# Patient Record
Sex: Male | Born: 1955 | Race: Black or African American | Hispanic: No | Marital: Married | Smoking: Current some day smoker
Health system: Southern US, Community
[De-identification: ages and names within clinical notes are randomized; demographics above are authoritative.]

## PROBLEM LIST (undated history)

## (undated) DIAGNOSIS — I2510 Atherosclerotic heart disease of native coronary artery without angina pectoris: Secondary | ICD-10-CM

## (undated) DIAGNOSIS — E119 Type 2 diabetes mellitus without complications: Secondary | ICD-10-CM

## (undated) DIAGNOSIS — I10 Essential (primary) hypertension: Secondary | ICD-10-CM

## (undated) DIAGNOSIS — E1169 Type 2 diabetes mellitus with other specified complication: Secondary | ICD-10-CM

## (undated) HISTORY — PX: TONSILLECTOMY: SUR1361

## (undated) HISTORY — PX: APPENDECTOMY: SHX54

## (undated) HISTORY — DX: Atherosclerotic heart disease of native coronary artery without angina pectoris: I25.10

---

## 1998-10-01 ENCOUNTER — Ambulatory Visit (HOSPITAL_COMMUNITY): Admission: RE | Admit: 1998-10-01 | Discharge: 1998-10-01 | Payer: Self-pay | Admitting: *Deleted

## 2002-03-12 ENCOUNTER — Encounter (INDEPENDENT_AMBULATORY_CARE_PROVIDER_SITE_OTHER): Payer: Self-pay | Admitting: Specialist

## 2002-03-12 ENCOUNTER — Ambulatory Visit (HOSPITAL_COMMUNITY): Admission: RE | Admit: 2002-03-12 | Discharge: 2002-03-12 | Payer: Self-pay | Admitting: *Deleted

## 2003-12-13 ENCOUNTER — Emergency Department (HOSPITAL_COMMUNITY): Admission: EM | Admit: 2003-12-13 | Discharge: 2003-12-13 | Payer: Self-pay | Admitting: Emergency Medicine

## 2007-12-31 ENCOUNTER — Ambulatory Visit: Payer: Self-pay | Admitting: Gastroenterology

## 2008-01-07 ENCOUNTER — Encounter: Payer: Self-pay | Admitting: Gastroenterology

## 2008-01-07 ENCOUNTER — Ambulatory Visit: Payer: Self-pay | Admitting: Gastroenterology

## 2008-01-10 ENCOUNTER — Encounter: Payer: Self-pay | Admitting: Gastroenterology

## 2010-07-20 NOTE — Letter (Signed)
Summary: Patient Notice- Polyp Results  [facility]  [address] [place], [place] [address]   Phone: [phone]  Fax: [phone]        January 10, 2008 MRN: [id number]    Roger Fernandez [address] [place], [place]  [address]    Dear Mr. MAGID,  I am pleased to inform you that the colon polyp(s) removed during your recent colonoscopy was (were) found to be benign (no cancer detected) upon pathologic examination.  I recommend you have a repeat colonoscopy examination in _7 years to look for recurrent polyps, as having colon polyps increases your risk for having recurrent polyps or even colon cancer in the future.  Should you develop new or worsening symptoms of abdominal pain, bowel habit changes or bleeding from the rectum or bowels, please schedule an evaluation with either your primary care physician or with me.  Additional information/recommendations:  __ No further action with gastroenterology is needed at this time. Please      follow-up with your primary care physician for your other healthcare      needs.  __ Please call [phone] to schedule a return visit to review your      situation.  __ Please keep your follow-up visit as already scheduled.  xContinue treatment plan as outlined the day of your exam.  Please call us if you are having persistent problems or have questions about your condition that have not been fully answered at this time.  Sincerely,  Louis Meckel MD  This letter has been electronically signed by your physician.

## 2010-07-20 NOTE — Procedures (Signed)
Summary: Colonoscopy   Colonoscopy  Procedure date:  01/07/2008  Findings:      Location:  [facility].    Procedures Next Due Date:    Colonoscopy: 12/2014  Patient Name: Roger Fernandez, Roger Fernandez. MRN: [id number] Procedure Procedures: Colonoscopy CPT: [phone].    with Hot Biopsy(s)CPT: [id number].    with polypectomy. CPT: A3573898.  Personnel: Endoscopist: Barbette Hair. Arlyce Dice, Roger Fernandez.  Exam Location: Outpatient  Patient Consent: Procedure, Alternatives, Risks and Benefits discussed, consent obtained, from patient.  Indications  Surveillance of: Adenomatous Polyp(s).  History  Current Medications: Patient is not currently taking Coumadin.  Pre-Exam Physical: Performed Jan 07, 2008. Cardio-pulmonary exam, HEENT exam , Abdominal exam, Mental status exam WNL.  Comments: Patient history reviewed/updated, physical performed prior to initiation of sedation?yes Exam Exam: Extent of exam reached: Cecum, extent intended: Cecum.  The cecum was identified by appendiceal orifice and IC valve. Time to Cecum: 00:03: 10. Time for Withdrawl: 00:13:50. Colon retroflexion performed. ASA Classification: I. Tolerance: good.  Monitoring: Pulse and BP monitoring, Oximetry used. Supplemental O2 given. at 2 Liters.  Colon Prep Used Moviprep for colon prep. Prep results: good.  Sedation Meds: Patient assessed and found to be appropriate for moderate (conscious) sedation. Sedation was managed by the Endoscopist. Fentanyl 75 mcg. given IV. Versed 7 mg. given IV.  Findings POLYP: Ascending Colon, Maximum size: 4 mm. sessile polyp. Procedure:  snare with cautery, Polyp sent to pathology. ICD9: Colon Polyps: 211.3.  - DIVERTICULOSIS: Descending Colon to Sigmoid Colon. ICD9: Diverticulosis: 562.10. Comments: Diffuse diverticulosis with multiple wide-mouthed diverticula.  NORMAL EXAM: Cecum.  POLYP: Sigmoid Colon, Maximum size: 2 mm. Distance from Anus 20 cm. Procedure:  hot biopsy, sent to  pathology.  POLYP: Sigmoid Colon, Maximum size: 2 mm. Distance from Anus 18 cm. Procedure:  hot biopsy, sent to pathology.  NORMAL EXAM: Rectum.  POLYP: Rectum, Maximum size: 2 mm. Distance from Anus 1 cm. Procedure:  hot biopsy, sent to pathology.   Assessment Abnormal examination, see findings above.  Diagnoses: 211.3: Colon Polyps.  562.10: Diverticulosis.   Events  Unplanned Interventions: No intervention was required.  Unplanned Events: There were no complications. Plans  Post Exam Instructions: Post sedation instructions given.  Patient Education: Patient given standard instructions for: Polyps. Diverticulosis.  Disposition: After procedure patient sent to recovery. After recovery patient sent home.  Scheduling/Referral: Colonoscopy, to Barbette Hair. Arlyce Dice, Roger Fernandez, if adenomas, around Jan 06, 2013.    REPORT OF SURGICAL PATHOLOGY   Case #: [id number] Patient Name: Roger Fernandez, Roger Fernandez. Office Chart Number:  N/A   MRN: [id number] Pathologist: Lyn Hollingshead. Delila Spence, Roger Fernandez DOB/Age  [birth year] (Age: 55)    Gender: M Date Taken:  01/07/2008 Date Received: 01/08/2008   FINAL DIAGNOSIS   ***MICROSCOPIC EXAMINATION AND DIAGNOSIS***   1.  ASCENDING COLON, POLYP:  HYPERPLASTIC POLYP.  NO ADENOMATOUS CHANGE OR MALIGNANCY IDENTIFIED.   2.  SIGMOID COLON, POLYP(S):  HYPERPLASTIC POLYP(S).  NO ADENOMATOUS CHANGE OR MALIGNANCY IDENTIFIED.   3.  RECTUM, POLYP(S):  HYPERPLASTIC POLYP(S). NO ADENOMATOUS CHANGES OR EVIDENCE OF MALIGNANCY IDENTIFIED (BIOPSY).    kv Date Reported:  01/09/2008     Lyn Hollingshead. Delila Spence, Roger Fernandez *** Electronically Signed Out By EAA ***   Clinical information (gt)   specimen(s) obtained 1: Colon, polyp(s), ascending 2: Colon, polyp(s), sigmoid 3: Rectum, polyp(s)   Gross Description 1.  Received in formalin is a tan, soft tissue fragment that is submitted in toto.  Size:  1 cm   2. Received in formalin are  tan, soft tissue fragments that are submitted in toto.   Number:  two. Size:  0.1 and 0.2 cm.   3.  Received in formalin are tan, soft tissue fragments that are submitted in toto.  Number:  multiple. Size:  0.2 to 0.3 cm.   (GP:gt, 01/08/08)   gdt/     Signed by Louis Meckel Roger Fernandez on 01/10/2008 at 6:42 PM This report was created from the original endoscopy report, which was reviewed and signed by the above listed endoscopist.   ________________________________________________________________________ colo 7 years This report was created from the original endoscopy report, which was reviewed and signed by the above listed endoscopist.   January 10, 2008 MRN: [id number]    Roger Fernandez [address] [place], [place]  [address]    Dear Mr. Roger Fernandez,  I am pleased to inform you that the colon polyp(s) removed during your recent colonoscopy was (were) found to be benign (no cancer detected) upon pathologic examination.  I recommend you have a repeat colonoscopy examination in _7 years to look for recurrent polyps, as having colon polyps increases your risk for having recurrent polyps or even colon cancer in the future.  Should you develop new or worsening symptoms of abdominal pain, bowel habit changes or bleeding from the rectum or bowels, please schedule an evaluation with either your primary care physician or with me.  Additional information/recommendations:  __ No further action with gastroenterology is needed at this time. Please      follow-up with your primary care physician for your other healthcare      needs.  __ Please call [phone] to schedule a return visit to review your      situation.  __ Please keep your follow-up visit as already scheduled.  xContinue treatment plan as outlined the day of your exam.  Please call us if you are having persistent problems or have questions about your condition that have not been fully answered at this time.  Sincerely,  Louis Meckel Roger Fernandez  This letter has been electronically signed  by your physician.   Signed by Louis Meckel Roger Fernandez on 01/10/2008 at 6:42 PM  ________________________________________________________________________   Signed by Louis Meckel Roger Fernandez on 01/10/2008 at 6:42 PM  ________________________________________________________________________  cc.   Roger Pinkerton Bellow Shaw,Roger Fernandez    January 10, 2008 MRN: [id number]    Roger Fernandez [address] [place], [place]  [address]    Dear Mr. Roger Fernandez,  I am pleased to inform you that the colon polyp(s) removed during your recent colonoscopy was (were) found to be benign (no cancer detected) upon pathologic examination.  I recommend you have a repeat colonoscopy examination in _7 years to look for recurrent polyps, as having colon polyps increases your risk for having recurrent polyps or even colon cancer in the future.  Should you develop new or worsening symptoms of abdominal pain, bowel habit changes or bleeding from the rectum or bowels, please schedule an evaluation with either your primary care physician or with me.  Additional information/recommendations:  __ No further action with gastroenterology is needed at this time. Please      follow-up with your primary care physician for your other healthcare      needs.  __ Please call [phone] to schedule a return visit to review your      situation.  __ Please keep your follow-up visit as already scheduled.  xContinue treatment plan as outlined the day of your exam.  Please  call us if you are having persistent problems or have questions about your condition that have not been fully answered at this time.  Sincerely,  Louis Meckel Roger Fernandez  This letter has been electronically signed by your physician.   Signed by Louis Meckel Roger Fernandez on 01/10/2008 at 6:42 PM  ________________________________________________________________________

## 2010-07-20 NOTE — Miscellaneous (Signed)
Summary: [facility] Previsit/prep  Clinical Lists Changes  Medications: Added new medication of MOVIPREP 100 GM  SOLR (PEG-KCL-NACL-NASULF-NA ASC-C) As per prep instructions. - Signed Rx of MOVIPREP 100 GM  SOLR (PEG-KCL-NACL-NASULF-NA ASC-C) As per prep instructions.;  #1 x 0;  Signed;  Entered by: Wyona Almas RN;  Authorized by: Louis Meckel MD;  Method used: Electronic Observations: Added new observation of NKA: T (12/31/2007 16:46)    Prescriptions: MOVIPREP 100 GM  SOLR (PEG-KCL-NACL-NASULF-NA ASC-C) As per prep instructions.  #1 x 0   Entered by:   Wyona Almas RN   Authorized by:   Louis Meckel MD   Signed by:   Wyona Almas RN on 12/31/2007   Method used:   Electronically sent to ...       [facility] [address] [phone]*       [address]       [place], [place]  [address]       Ph: [phone] or [phone]       Fax: [phone]   RxID:   [id number]

## 2010-11-05 NOTE — Op Note (Signed)
NAME:  Roger Fernandez, Roger Fernandez                          ACCOUNT NO.:  [id number]   MEDICAL RECORD NO.:  [id number]                   PATIENT TYPE:  AMB   LOCATION:  ENDO                                 FACILITY:  [facility]   PHYSICIAN:  Sharyn Dross., M.D.               DATE OF BIRTH:  1955-11-14   DATE OF PROCEDURE:  03/12/2002  DATE OF DISCHARGE:                                 OPERATIVE REPORT   PREOPERATIVE DIAGNOSIS:  History of colon polyps.   POSTOPERATIVE DIAGNOSES:  1. Diverticulosis.  2. Small polyp removed via hot biopsy.   PROCEDURE PERFORMED:  Colonoscopy with hot biopsy.   REFERRING PHYSICIAN:  Dortha Kern, M.D.   MEDICATIONS:  Demerol 90 mg IV, Versed 9 mg IV over a ten minute period of  time.   INSTRUMENT USED:  Olympus video pancolonoscope.   INDICATIONS FOR PROCEDURE:  The patient is a pleasant 55 year old black  gentleman known with a family history of colon polyps that was present.  He  came in for an evaluation at this time.  He has previously had a  colonoscopic examination in the past and was found to have colon polyps.  The patient also notes that his father had colon polyps before his  expiration, done approximately 10-12 years ago.  He has had no major  complaints or discomforts at this time and his last colon was a while ago  (approximately 5-7 years ago).  He denies any changes in stool character or  color that was present at this time.   PHYSICAL EXAMINATION:  GENERAL:  He is a pleasant gentleman who appears to  be in no acute distress.  VITAL SIGNS:  Stable.  HEENT:  Was anicteric.  NECK:  Supple.  LUNGS:  Clear to auscultation and percussion.  HEART:  Regular rate and rhythm without heaves, thrills, murmurs, or  gallops.  ABDOMEN:  Was soft.  There was no gross tenderness to palpation, no  hepatosplenomegaly appreciated.  RECTAL:  Digital rectal exam was normal.  EXTREMITIES:  Showed no cyanosis, clubbing or edema.   GROSS IMPRESSION:  Family  history of colon polyps.   RECOMMENDATIONS:  The patient was brought in for a colonoscopic examination  for the evaluation  of this process.   INFORMED CONSENT:  The patient was advised of the procedure as well as its  indications and the risks involved.  The patient has viewed the video and a  consent form was obtained.   PREOPERATIVE PREPARATION:  The patient was brought into the hospital where  the IV for IV sedating medication was started.  A monitor was placed on the  patient to monitor the patient's vital signs and oxygen saturation.  Nasal  oxygen at approximately 2-3 L per minute was used and after adequate  sedation was performed, the procedure was begun.   BOWEL PREP:  The patient was given GoLYTELY as a bowel prep  with Reglan to  help empty the bowels.  The patient tolerated the prep well without any  complications.  The quality of the prep was good to excellent.   DESCRIPTION OF PROCEDURE:  The instrument was advanced with the patient  lying in the left lateral position via the direct technique without  difficulty to approximately 90 cm proximal colon to the cecum.  This was  confirmed by palpation,  and transillumination that was noted.   There appeared to be no gross abnormalities such as masses, strictures or  vascular abnormalities that was noted at this time.  The vascular pattern  appeared to be well within normal limits throughout the colon area that was  appreciated.  The mucosal pattern showed evidence of diverticulosis that was  present in the descending to distal transverse colon area that was noted at  this time.  The diverticula appeared to be small in character and there was  no evidence of any inflammatory changes noted surrounding the diverticula  that were appreciated.  There was no increased tortuosity of the colon that  was appreciated at this time.  The ileocecal valve which appeared to be  small in character was visualized at this time.  The  instrument was  gradually retracted back and removed per rectum without any evidence of  internal hemorrhoids noted.  The area was retroflexed for viewing and this  was also confirmed.  The instrument was subsequently removed per rectum  without difficulty.   The patient tolerated the procedure well.   TREATMENT:  1. Conservative management at this time.  2. Will await the results of the biopsy report that was obtained.  3. Will have the patient follow up with me in the next two weeks at this     point.  4. High-fiber diet and a stool softener as deemed necessary.                                               Sharyn Dross., M.D.    JM/MEDQ  D:  03/12/2002  T:  03/13/2002  Job:  [id number]

## 2010-12-16 ENCOUNTER — Emergency Department (HOSPITAL_COMMUNITY)
Admission: EM | Admit: 2010-12-16 | Discharge: 2010-12-16 | Disposition: A | Discharge status: Home / Self Care | Payer: BC Managed Care – PPO | Attending: Emergency Medicine | Admitting: Emergency Medicine

## 2010-12-16 DIAGNOSIS — I10 Essential (primary) hypertension: Secondary | ICD-10-CM | POA: Insufficient documentation

## 2010-12-16 DIAGNOSIS — R220 Localized swelling, mass and lump, head: Secondary | ICD-10-CM | POA: Insufficient documentation

## 2010-12-16 DIAGNOSIS — R209 Unspecified disturbances of skin sensation: Secondary | ICD-10-CM | POA: Insufficient documentation

## 2010-12-16 DIAGNOSIS — M542 Cervicalgia: Secondary | ICD-10-CM | POA: Insufficient documentation

## 2010-12-16 DIAGNOSIS — Z79899 Other long term (current) drug therapy: Secondary | ICD-10-CM | POA: Insufficient documentation

## 2010-12-16 DIAGNOSIS — T63481A Toxic effect of venom of other arthropod, accidental (unintentional), initial encounter: Secondary | ICD-10-CM | POA: Insufficient documentation

## 2010-12-16 DIAGNOSIS — T6391XA Toxic effect of contact with unspecified venomous animal, accidental (unintentional), initial encounter: Secondary | ICD-10-CM | POA: Insufficient documentation

## 2010-12-16 DIAGNOSIS — R0789 Other chest pain: Secondary | ICD-10-CM | POA: Insufficient documentation

## 2010-12-16 DIAGNOSIS — R221 Localized swelling, mass and lump, neck: Secondary | ICD-10-CM | POA: Insufficient documentation

## 2011-04-21 ENCOUNTER — Other Ambulatory Visit: Payer: Self-pay | Admitting: Internal Medicine

## 2011-04-21 DIAGNOSIS — R209 Unspecified disturbances of skin sensation: Secondary | ICD-10-CM

## 2011-04-21 DIAGNOSIS — M79609 Pain in unspecified limb: Secondary | ICD-10-CM

## 2011-04-23 ENCOUNTER — Ambulatory Visit
Admission: RE | Admit: 2011-04-23 | Discharge: 2011-04-23 | Disposition: A | Discharge status: Home / Self Care | Payer: BC Managed Care – PPO | Source: Ambulatory Visit | Attending: Internal Medicine | Admitting: Internal Medicine

## 2011-04-23 DIAGNOSIS — R209 Unspecified disturbances of skin sensation: Secondary | ICD-10-CM

## 2011-04-23 DIAGNOSIS — M79609 Pain in unspecified limb: Secondary | ICD-10-CM

## 2011-05-31 ENCOUNTER — Other Ambulatory Visit: Payer: Self-pay | Admitting: Neurosurgery

## 2011-06-02 ENCOUNTER — Encounter (HOSPITAL_COMMUNITY): Payer: Self-pay

## 2011-06-03 ENCOUNTER — Encounter (HOSPITAL_COMMUNITY): Payer: Self-pay

## 2011-06-03 ENCOUNTER — Encounter (HOSPITAL_COMMUNITY)
Admission: RE | Admit: 2011-06-03 | Discharge: 2011-06-03 | Disposition: A | Discharge status: Home / Self Care | Payer: BC Managed Care – PPO | Source: Ambulatory Visit | Attending: Neurosurgery | Admitting: Neurosurgery

## 2011-06-03 HISTORY — DX: Essential (primary) hypertension: I10

## 2011-06-03 LAB — BASIC METABOLIC PANEL
BUN: 12 mg/dL (ref 6–23)
CO2: 26 mEq/L (ref 19–32)
Calcium: 10.1 mg/dL (ref 8.4–10.5)
Chloride: 101 mEq/L (ref 96–112)
Creatinine, Ser: 0.88 mg/dL (ref 0.50–1.35)
GFR calc Af Amer: >90 mL/min (ref >90)
GFR calc non Af Amer: >90 mL/min (ref >90)
Glucose, Bld: 82 mg/dL (ref 70–99)
Potassium: 3.8 mEq/L (ref 3.5–5.1)
Sodium: 140 mEq/L (ref 135–145)

## 2011-06-03 LAB — CBC
HCT: 40.5 % (ref 39.0–52.0)
Hemoglobin: 14.3 g/dL (ref 13.0–17.0)
MCH: 30.3 pg (ref 26.0–34.0)
MCHC: 35.3 g/dL (ref 30.0–36.0)
MCV: 85.8 fL (ref 78.0–100.0)
Platelets: 275 10*3/uL (ref 150–400)
RBC: 4.72 MIL/uL (ref 4.22–5.81)
RDW: 13.1 % (ref 11.5–15.5)
WBC: 9.6 10*3/uL (ref 4.0–10.5)

## 2011-06-03 LAB — SURGICAL PCR SCREEN
MRSA, PCR: NEGATIVE
Staphylococcus aureus: NEGATIVE

## 2011-06-03 NOTE — Progress Notes (Signed)
DR Garnette Scheuermann CALLED FOR ECHO,STRESS TEST.  DR  Martha Clan CALLED FOR CXR AND EKG.

## 2011-06-03 NOTE — Pre-Procedure Instructions (Signed)
20 JO CERONE  06/03/2011   Your procedure is scheduled on:  06/06/11  Report to [facility] at 1;20PM   Call this number if you have problems the morning of surgery: [phone]   Remember:   Do not eat food:After Midnight.  May have clear liquids: up to 4 Hours before arrival.  Clear liquids include soda, tea, black coffee, apple or grape juice, broth.  Take these medicines the morning of surgery with A SIP OF WATER: NONE   Do not wear jewelry, make-up or nail polish.  Do not wear lotions, powders, or perfumes. You may wear deodorant.  Do not shave 48 hours prior to surgery.  Do not bring valuables to the hospital.  Contacts, dentures or bridgework may not be worn into surgery.  Leave suitcase in the car. After surgery it may be brought to your room.  For patients admitted to the hospital, checkout time is 11:00 AM the day of discharge.   Patients discharged the day of surgery will not be allowed to drive home.  Name and phone number of your driver: FAMILY  Special Instructions: CHG Shower Use Special Wash: 1/2 bottle night before surgery and 1/2 bottle morning of surgery.   Please read over the following fact sheets that you were given: Pain Booklet, MRSA Information and Surgical Site Infection Prevention

## 2011-06-05 MED ORDER — CEFAZOLIN SODIUM-DEXTROSE 2-3 GM-% IV SOLR
2.0000 g | INTRAVENOUS | Status: DC
  Filled 2011-06-05: qty 50

## 2011-06-06 ENCOUNTER — Encounter (HOSPITAL_COMMUNITY): Payer: Self-pay | Admitting: Anesthesiology

## 2011-06-06 ENCOUNTER — Ambulatory Visit (HOSPITAL_COMMUNITY): Payer: BC Managed Care – PPO

## 2011-06-06 ENCOUNTER — Encounter (HOSPITAL_COMMUNITY): Payer: Self-pay | Admitting: Surgery

## 2011-06-06 ENCOUNTER — Ambulatory Visit (HOSPITAL_COMMUNITY): Payer: BC Managed Care – PPO | Admitting: Anesthesiology

## 2011-06-06 ENCOUNTER — Encounter (HOSPITAL_COMMUNITY)
Admission: RE | Disposition: A | Discharge status: Home / Self Care | Payer: Self-pay | Source: Ambulatory Visit | Attending: Neurosurgery

## 2011-06-06 ENCOUNTER — Ambulatory Visit (HOSPITAL_COMMUNITY)
Admission: RE | Admit: 2011-06-06 | Discharge: 2011-06-07 | Disposition: A | Discharge status: Home / Self Care | Payer: BC Managed Care – PPO | Source: Ambulatory Visit | Attending: Neurosurgery | Admitting: Neurosurgery

## 2011-06-06 DIAGNOSIS — Z01812 Encounter for preprocedural laboratory examination: Secondary | ICD-10-CM | POA: Insufficient documentation

## 2011-06-06 DIAGNOSIS — M4712 Other spondylosis with myelopathy, cervical region: Secondary | ICD-10-CM | POA: Insufficient documentation

## 2011-06-06 HISTORY — PX: ANTERIOR CERVICAL DECOMP/DISCECTOMY FUSION: SHX1161

## 2011-06-06 SURGERY — ANTERIOR CERVICAL DECOMPRESSION/DISCECTOMY FUSION 3 LEVELS
Anesthesia: General | Site: Spine Cervical | Wound class: Clean

## 2011-06-06 MED ORDER — LIDOCAINE-EPINEPHRINE 1 %-1:100000 IJ SOLN
INTRAMUSCULAR | Status: DC | PRN
  Administered 2011-06-06: 5 mL

## 2011-06-06 MED ORDER — SODIUM CHLORIDE 0.9 % IR SOLN
Status: DC | PRN
  Administered 2011-06-06: 18:00:00

## 2011-06-06 MED ORDER — MORPHINE SULFATE 4 MG/ML IJ SOLN
4.0000 mg | INTRAMUSCULAR | Status: DC | PRN
  Administered 2011-06-07: 4 mg via INTRAMUSCULAR
  Filled 2011-06-06: qty 1

## 2011-06-06 MED ORDER — KCL IN DEXTROSE-NACL 20-5-0.45 MEQ/L-%-% IV SOLN
INTRAVENOUS | Status: DC
  Filled 2011-06-06 (×5): qty 1000

## 2011-06-06 MED ORDER — PHENYLEPHRINE HCL 10 MG/ML IJ SOLN
INTRAMUSCULAR | Status: DC | PRN
  Administered 2011-06-06 (×3): 80 ug via INTRAVENOUS

## 2011-06-06 MED ORDER — PHENOL 1.4 % MT LIQD: 1.0000 | OROMUCOSAL | Status: DC | PRN

## 2011-06-06 MED ORDER — CEFAZOLIN SODIUM 1-5 GM-% IV SOLN
INTRAVENOUS | Status: DC | PRN
  Administered 2011-06-06: 2 g via INTRAVENOUS

## 2011-06-06 MED ORDER — GLYCOPYRROLATE 0.2 MG/ML IJ SOLN
INTRAMUSCULAR | Status: DC | PRN
  Administered 2011-06-06: .8 mg via INTRAVENOUS

## 2011-06-06 MED ORDER — ACETAMINOPHEN 650 MG RE SUPP: 650.0000 mg | RECTAL | Status: DC | PRN

## 2011-06-06 MED ORDER — HYDROMORPHONE HCL PF 1 MG/ML IJ SOLN
0.2500 mg | INTRAMUSCULAR | Status: DC | PRN
  Administered 2011-06-06 (×4): 0.5 mg via INTRAVENOUS

## 2011-06-06 MED ORDER — ACETAMINOPHEN 325 MG PO TABS: 650.0000 mg | ORAL_TABLET | ORAL | Status: DC | PRN

## 2011-06-06 MED ORDER — HYDROCODONE-ACETAMINOPHEN 5-325 MG PO TABS: 1.0000 | ORAL_TABLET | ORAL | Status: DC | PRN

## 2011-06-06 MED ORDER — CYCLOBENZAPRINE HCL 10 MG PO TABS
10.0000 mg | ORAL_TABLET | Freq: Three times a day (TID) | ORAL | Status: DC | PRN
  Administered 2011-06-06 – 2011-06-07 (×2): 10 mg via ORAL
  Filled 2011-06-06 (×3): qty 1

## 2011-06-06 MED ORDER — NEOSTIGMINE METHYLSULFATE 1 MG/ML IJ SOLN
INTRAMUSCULAR | Status: DC | PRN
  Administered 2011-06-06: 5 mg via INTRAVENOUS

## 2011-06-06 MED ORDER — MIDAZOLAM HCL 5 MG/5ML IJ SOLN
INTRAMUSCULAR | Status: DC | PRN
  Administered 2011-06-06: 2 mg via INTRAVENOUS

## 2011-06-06 MED ORDER — SODIUM CHLORIDE 0.9 % IJ SOLN: 3.0000 mL | INTRAMUSCULAR | Status: DC | PRN

## 2011-06-06 MED ORDER — THROMBIN 20000 UNITS EX KIT
PACK | CUTANEOUS | Status: DC | PRN
  Administered 2011-06-06: 20000 [IU] via TOPICAL

## 2011-06-06 MED ORDER — HEMOSTATIC AGENTS (NO CHARGE) OPTIME
TOPICAL | Status: DC | PRN
  Administered 2011-06-06: 1 via TOPICAL

## 2011-06-06 MED ORDER — BUPIVACAINE HCL (PF) 0.25 % IJ SOLN
INTRAMUSCULAR | Status: DC | PRN
  Administered 2011-06-06: 5 mL

## 2011-06-06 MED ORDER — KETOROLAC TROMETHAMINE 30 MG/ML IJ SOLN
30.0000 mg | Freq: Once | INTRAMUSCULAR | Status: AC
  Administered 2011-06-06: 30 mg via INTRAVENOUS

## 2011-06-06 MED ORDER — DEXAMETHASONE SODIUM PHOSPHATE 4 MG/ML IJ SOLN
INTRAMUSCULAR | Status: DC | PRN
  Administered 2011-06-06: 8 mg via INTRAVENOUS

## 2011-06-06 MED ORDER — ROCURONIUM BROMIDE 100 MG/10ML IV SOLN
INTRAVENOUS | Status: DC | PRN
  Administered 2011-06-06: 10 mg via INTRAVENOUS
  Administered 2011-06-06: 50 mg via INTRAVENOUS
  Administered 2011-06-06: 10 mg via INTRAVENOUS
  Administered 2011-06-06: 5 mg via INTRAVENOUS

## 2011-06-06 MED ORDER — ONDANSETRON HCL 4 MG/2ML IJ SOLN
INTRAMUSCULAR | Status: DC | PRN
  Administered 2011-06-06: 4 mg via INTRAVENOUS

## 2011-06-06 MED ORDER — FENTANYL CITRATE 0.05 MG/ML IJ SOLN
INTRAMUSCULAR | Status: DC | PRN
  Administered 2011-06-06: 100 ug via INTRAVENOUS
  Administered 2011-06-06 (×4): 50 ug via INTRAVENOUS

## 2011-06-06 MED ORDER — ZOLPIDEM TARTRATE 5 MG PO TABS: 10.0000 mg | ORAL_TABLET | Freq: Every evening | ORAL | Status: DC | PRN

## 2011-06-06 MED ORDER — OXYCODONE-ACETAMINOPHEN 5-325 MG PO TABS
1.0000 | ORAL_TABLET | ORAL | Status: DC | PRN
  Administered 2011-06-06 – 2011-06-07 (×4): 2 via ORAL
  Filled 2011-06-06 (×4): qty 2

## 2011-06-06 MED ORDER — ONDANSETRON HCL 4 MG/2ML IJ SOLN: 4.0000 mg | Freq: Four times a day (QID) | INTRAMUSCULAR | Status: DC | PRN

## 2011-06-06 MED ORDER — KETOROLAC TROMETHAMINE 30 MG/ML IJ SOLN
30.0000 mg | Freq: Four times a day (QID) | INTRAMUSCULAR | Status: DC
  Administered 2011-06-06 – 2011-06-07 (×3): 30 mg via INTRAVENOUS
  Filled 2011-06-06 (×6): qty 1

## 2011-06-06 MED ORDER — ALUM & MAG HYDROXIDE-SIMETH 200-200-20 MG/5ML PO SUSP: 30.0000 mL | Freq: Four times a day (QID) | ORAL | Status: DC | PRN

## 2011-06-06 MED ORDER — MENTHOL 3 MG MT LOZG: 1.0000 | LOZENGE | OROMUCOSAL | Status: DC | PRN

## 2011-06-06 MED ORDER — SODIUM CHLORIDE 0.9 % IJ SOLN: 3.0000 mL | Freq: Two times a day (BID) | INTRAMUSCULAR | Status: DC

## 2011-06-06 MED ORDER — LACTATED RINGERS IV SOLN
INTRAVENOUS | Status: DC | PRN
  Administered 2011-06-06 (×4): via INTRAVENOUS

## 2011-06-06 MED ORDER — PROPOFOL 10 MG/ML IV EMUL
INTRAVENOUS | Status: DC | PRN
  Administered 2011-06-06: 200 mg via INTRAVENOUS

## 2011-06-06 MED ORDER — 0.9 % SODIUM CHLORIDE (POUR BTL) OPTIME
TOPICAL | Status: DC | PRN
  Administered 2011-06-06: 1000 mL

## 2011-06-06 MED ORDER — HYDROXYZINE HCL 50 MG/ML IM SOLN: 50.0000 mg | INTRAMUSCULAR | Status: DC | PRN

## 2011-06-06 MED ORDER — HYDROXYZINE HCL 25 MG PO TABS: 50.0000 mg | ORAL_TABLET | ORAL | Status: DC | PRN

## 2011-06-06 SURGICAL SUPPLY — 55 items
ADH SKN CLS APL DERMABOND .7 (GAUZE/BANDAGES/DRESSINGS) ×2
ALLOGRAFT CA 6X14X11 (Bone Implant) ×6 IMPLANT
BAG DECANTER FOR FLEXI CONT (MISCELLANEOUS) ×2 IMPLANT
BIT DRILL NEURO 2X3.1 SFT TUCH (MISCELLANEOUS) ×1 IMPLANT
BLADE ULTRA TIP 2M (BLADE) ×2 IMPLANT
BRUSH SCRUB EZ PLAIN DRY (MISCELLANEOUS) ×2 IMPLANT
CANISTER SUCTION 2500CC (MISCELLANEOUS) ×2 IMPLANT
CLOTH BEACON ORANGE TIMEOUT ST (SAFETY) ×2 IMPLANT
CONT SPEC 4OZ CLIKSEAL STRL BL (MISCELLANEOUS) ×2 IMPLANT
COVER MAYO STAND STRL (DRAPES) ×2 IMPLANT
DECANTER SPIKE VIAL GLASS SM (MISCELLANEOUS) ×2 IMPLANT
DERMABOND ADVANCED (GAUZE/BANDAGES/DRESSINGS) ×2
DERMABOND ADVANCED .7 DNX12 (GAUZE/BANDAGES/DRESSINGS) ×2 IMPLANT
DRAPE LAPAROTOMY 100X72 PEDS (DRAPES) ×2 IMPLANT
DRAPE MICROSCOPE LEICA (MISCELLANEOUS) ×2 IMPLANT
DRAPE POUCH INSTRU U-SHP 10X18 (DRAPES) ×2 IMPLANT
DRAPE PROXIMA HALF (DRAPES) IMPLANT
DRILL NEURO 2X3.1 SOFT TOUCH (MISCELLANEOUS) ×2
ELECT COATED BLADE 2.86 ST (ELECTRODE) ×2 IMPLANT
ELECT REM PT RETURN 9FT ADLT (ELECTROSURGICAL) ×2
ELECTRODE REM PT RTRN 9FT ADLT (ELECTROSURGICAL) ×1 IMPLANT
GLOVE BIO SURGEON STRL SZ 6.5 (GLOVE) ×4 IMPLANT
GLOVE BIOGEL PI IND STRL 6.5 (GLOVE) ×2 IMPLANT
GLOVE BIOGEL PI IND STRL 7.5 (GLOVE) ×1 IMPLANT
GLOVE BIOGEL PI IND STRL 8 (GLOVE) ×1 IMPLANT
GLOVE BIOGEL PI INDICATOR 6.5 (GLOVE) ×2
GLOVE BIOGEL PI INDICATOR 7.5 (GLOVE) ×1
GLOVE BIOGEL PI INDICATOR 8 (GLOVE) ×1
GLOVE ECLIPSE 7.5 STRL STRAW (GLOVE) ×4 IMPLANT
GLOVE EXAM NITRILE LRG STRL (GLOVE) IMPLANT
GLOVE EXAM NITRILE MD LF STRL (GLOVE) ×4 IMPLANT
GLOVE EXAM NITRILE XL STR (GLOVE) IMPLANT
GLOVE EXAM NITRILE XS STR PU (GLOVE) IMPLANT
GOWN BRE IMP SLV AUR LG STRL (GOWN DISPOSABLE) ×6 IMPLANT
GOWN BRE IMP SLV AUR XL STRL (GOWN DISPOSABLE) ×2 IMPLANT
GOWN STRL REIN 2XL LVL4 (GOWN DISPOSABLE) IMPLANT
HEAD HALTER (SOFTGOODS) ×2 IMPLANT
KIT BASIN OR (CUSTOM PROCEDURE TRAY) ×2 IMPLANT
KIT ROOM TURNOVER OR (KITS) ×2 IMPLANT
NEEDLE HYPO 25X1 1.5 SAFETY (NEEDLE) ×2 IMPLANT
NEEDLE SPNL 22GX3.5 QUINCKE BK (NEEDLE) ×4 IMPLANT
NS IRRIG 1000ML POUR BTL (IV SOLUTION) ×2 IMPLANT
PACK LAMINECTOMY NEURO (CUSTOM PROCEDURE TRAY) ×2 IMPLANT
PAD ARMBOARD 7.5X6 YLW CONV (MISCELLANEOUS) ×6 IMPLANT
RUBBERBAND STERILE (MISCELLANEOUS) ×4 IMPLANT
SPONGE INTESTINAL PEANUT (DISPOSABLE) ×4 IMPLANT
SPONGE SURGIFOAM ABS GEL 100 (HEMOSTASIS) ×2 IMPLANT
SUT VIC AB 0 CT1 18XCR BRD8 (SUTURE) IMPLANT
SUT VIC AB 0 CT1 8-18 (SUTURE)
SUT VIC AB 2-0 CP2 18 (SUTURE) ×2 IMPLANT
SUT VIC AB 3-0 SH 8-18 (SUTURE) ×4 IMPLANT
SYR 20ML ECCENTRIC (SYRINGE) ×2 IMPLANT
TOWEL OR 17X24 6PK STRL BLUE (TOWEL DISPOSABLE) ×2 IMPLANT
TOWEL OR 17X26 10 PK STRL BLUE (TOWEL DISPOSABLE) ×4 IMPLANT
WATER STERILE IRR 1000ML POUR (IV SOLUTION) ×2 IMPLANT

## 2011-06-06 NOTE — Progress Notes (Signed)
Dr. Jacklynn Bue notified of EKG, Hx HTN and Stress test. No further orders received.

## 2011-06-06 NOTE — Anesthesia Procedure Notes (Signed)
Procedure Name: Intubation Date/Time: 06/06/2011 5:05 PM Performed by: Glendora Score Pre-anesthesia Checklist: Patient identified, Emergency Drugs available, Suction available and Patient being monitored Patient Re-evaluated:Patient Re-evaluated prior to inductionOxygen Delivery Method: Circle System Utilized Preoxygenation: Pre-oxygenation with 100% oxygen Intubation Type: IV induction Ventilation: Mask ventilation without difficulty and Oral airway inserted - appropriate to patient size Laryngoscope Size: Miller and 2 Grade View: Grade IV Tube size: 7.5 mm Number of attempts: 2 Airway Equipment and Method: stylet and video-laryngoscopy Placement Confirmation: ETT inserted through vocal cords under direct vision,  positive ETCO2 and breath sounds checked- equal and bilateral Secured at: 21 cm Tube secured with: Tape Dental Injury: Teeth and Oropharynx as per pre-operative assessment

## 2011-06-06 NOTE — Progress Notes (Signed)
Filed Vitals:   06/06/11 1323 06/06/11 2033 06/06/11 2045  BP: 139/84 148/76 151/73  Pulse: 79 70 78  Temp: 98.3 F (36.8 C) 98.4 F (36.9 C)   TempSrc: Oral    Resp: 18 18 21   SpO2: 97% 100% 100%    Patient resting comfortably in PACU. Moving all 4 extremities well. Incision is clean and dry without erythema swelling or drainage. Foley to straight drainage.   Plan: Patient to be transferred to the 3500 unit. Encouraged to ambulate. We'll DC Foley at 0600.

## 2011-06-06 NOTE — Preoperative (Signed)
Beta Blockers   Reason not to administer Beta Blockers:Not Applicable 

## 2011-06-06 NOTE — Op Note (Signed)
06/06/2011  8:31 PM  PATIENT:  Roger Fernandez  55 y.o. 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 Patient was placed in 10 pounds of halter traction. The neck was prepped with Betadine soap and solution and draped in a sterile fashion. A obliquel incision was made on the left side of the neck paralleling the anterior border of the sternocleidomastoid.. The line of the incision was infiltrated with local anesthetic with epinephrine. Dissection was carried down thru the subcutaneous tissue and platysma, bipolar cautery was used to maintain hemostasis. Dissection was then carried out thru an avascular plane leaving the sternocleidomastoid carotid artery and jugular vein laterally and the trachea and esophagus medially. The ventral aspect of the vertebral column was identified and a localizing x-ray was taken. The C3-4 C4-5 C5-6 levels were identified. The annulus at each level was incised and the disc space entered. Discectomy was performed with micro-curettes and pituitary rongeurs. The operating microscope was draped and brought into the field provided additional magnification illumination and visualization. Discectomy was continued  posteriorly thru the disc space and then the cartilaginous endplate was removed using micro-curettes along with the high-speed drill. Posterior osteophytic overgrowth was removed at each level using the high-speed drill along with a 2 mm thin footplated Kerrison punch. Posterior longitudinal ligament along with disc herniation was carefully removed, decompressing the spinal canal and thecal sac. We then continued to remove osteophytic overgrowth and disc material decompressing the neural foramina and exiting nerve roots bilaterally. Once the decompression was completed hemostasis was established at each level with the use of Gelfoam with thrombin and bipolar cautery. The Gelfoam was removed the wound irrigated and hemostasis confirmed. We then measured the height of each intravertebral disc space level and selected a 6 millimeter in height structural allograft for the C3-4 level, a 6 millimeter in height structural allograft for the C4-5 level, and a 6 millimeter in height structural allograft for the C5-6 level . Each was hydrated in saline solution and then gently positioned in the intravertebral disc space and countersunk. We then selected a 52 millimeter in height Tether cervical plate. It was positioned over the fusion construct and secured to the vertebra with 4 x 15 mm screws. Each screw hole was started with the high-speed drill and then the screws placed, once all the screws were placed final tightening was performed. The wound was irrigated with bacitracin solution checked for hemostasis which was established and confirmed. An x-ray was taken which showed the grafts and plate in good position and the overall alignment was good. We then proceeded with closure. The platysma was closed with interrupted inverted 2-0 undyed Vicryl suture, the subcutaneous and subcuticular closed with interrupted inverted 3-0 undyed Vicryl suture. The skin edges were approximated with Dermabond. Following surgery the patient was  taken out of cervical traction. To be reversed and  the anesthetic and taken to the recovery room for further care.   PLAN OF CARE: Admit for overnight observation  PATIENT DISPOSITION:  PACU - hemodynamically stable.   Delay start of Pharmacological VTE agent (>24hrs) due to surgical blood loss or risk of bleeding:  yes

## 2011-06-06 NOTE — Anesthesia Postprocedure Evaluation (Signed)
  Anesthesia Post-op Note  Patient: Roger Fernandez  Procedure(s) Performed:  ANTERIOR CERVICAL DECOMPRESSION/DISCECTOMY FUSION 3 LEVELS - Cervical Three-four,Cervical Four-Five,Cervical Five-Six anterior cervical decompression with fusion plating and bonegraft  Patient Location: PACU  Anesthesia Type: General  Level of Consciousness: awake  Airway and Oxygen Therapy: Patient Spontanous Breathing  Post-op Pain: mild  Post-op Assessment: Post-op Vital signs reviewed  Post-op Vital Signs: stable  Complications: No apparent anesthesia complications

## 2011-06-06 NOTE — Transfer of Care (Signed)
Immediate Anesthesia Transfer of Care Note  Patient: Roger Fernandez  Procedure(s) Performed:  ANTERIOR CERVICAL DECOMPRESSION/DISCECTOMY FUSION 3 LEVELS - Cervical Three-four,Cervical Four-Five,Cervical Five-Six anterior cervical decompression with fusion plating and bonegraft  Patient Location: PACU  Anesthesia Type: General  Level of Consciousness: awake and patient cooperative  Airway & Oxygen Therapy: Patient Spontanous Breathing and Patient connected to face mask oxygen  Post-op Assessment: Report given to PACU RN  Post vital signs: Reviewed and stable  Complications: No apparent anesthesia complications

## 2011-06-06 NOTE — Anesthesia Preprocedure Evaluation (Signed)
Anesthesia Evaluation  Patient identified by MRN, date of birth, ID band Patient awake    Reviewed: Allergy & Precautions, H&P , NPO status , Patient's Chart, lab work & pertinent test results  Airway Mallampati: II  Neck ROM: full    Dental   Pulmonary          Cardiovascular hypertension,     Neuro/Psych    GI/Hepatic   Endo/Other    Renal/GU      Musculoskeletal   Abdominal   Peds  Hematology   Anesthesia Other Findings   Reproductive/Obstetrics                           Anesthesia Physical Anesthesia Plan  ASA: II  Anesthesia Plan: General   Post-op Pain Management:    Induction: Intravenous  Airway Management Planned: Oral ETT  Additional Equipment:   Intra-op Plan:   Post-operative Plan: Extubation in OR  Informed Consent: I have reviewed the patients History and Physical, chart, labs and discussed the procedure including the risks, benefits and alternatives for the proposed anesthesia with the patient or authorized representative who has indicated his/her understanding and acceptance.     Plan Discussed with: CRNA and Surgeon  Anesthesia Plan Comments:         Anesthesia Quick Evaluation

## 2011-06-06 NOTE — H&P (Signed)
Subjective: Patient is a 55 y.o. 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 Symptoms began in late October of this year. He had pain from the neck running down through the left upper extremity to the shoulder arm and forearm. He had numbness in the left thumb. X-ray show multilevel degenerative disease and spondylosis at C3-4 C4-5 C5-6 and C6-7. MRI scan shows at C3-4 broad-based spondylitic disc protrusion with flattening of the ventral aspect of the spinal cord worse on the right than the left. At C4-5 there was severe stenosis with a large disc protrusion centrally extending bilaterally worse the right than the left with significant cord compression and increased signal within the spinal cord on the right side. At C5-6 systolic disc protrusion a broad-based fashion, at C6-7 there is degenerative disease and spondylosis but no significant stenosis or spinal cord compression.  Patient is admitted now for a 3 level CIII-4 C4-5 and C5-6 ACDF.   There are no active problems to display for this patient.  Past Medical History  Diagnosis Date  . Hypertension     DR Martha Clan PCP  DR [facility] CARDIAC    Past Surgical History  Procedure Date  . Tonsillectomy   . Appendectomy     Prescriptions prior to admission  Medication Sig Dispense Refill  . Benzocaine (ORAJEL MT) Use as directed 1 application in the mouth or throat as needed. For ulcers.       Marland Kitchen lisinopril-hydrochlorothiazide (PRINZIDE,ZESTORETIC) 20-12.5 MG per tablet Take 1 tablet by mouth daily.        . tadalafil (CIALIS) 10 MG tablet Take 10 mg by mouth daily as needed. For ED       No Known Allergies  History  Substance Use Topics  . Smoking status: Current Some Day Smoker    Types: Cigars  . Smokeless tobacco: Not on file  . Alcohol Use: Yes     OCC.    History reviewed. No pertinent family  history.   Review of Systems Pertinent items are noted in HPI.  Objective: Vital signs in last 24 hours: Temp:  [98.3 F (36.8 C)] 98.3 F (36.8 C) (12/17 1323) Pulse Rate:  [79] 79  (12/17 1323) Resp:  [18] 18  (12/17 1323) BP: (139)/(84) 139/84 mmHg (12/17 1323) SpO2:  [97 %] 97 % (12/17 1323)  EXAM: Patient is a well-developed well-nourished black male in no acute distress. Lungs are clear to auscultation , the patient has symmetrical respiratory excursion. Heart has a regular rate and rhythm normal S1 and S2 no murmur.   Abdomen is soft nontender nondistended bowel sounds are present. Extremity examination shows no clubbing cyanosis or edema. Musculoskeletal examination shows no dislocation of the cervical spinous processes or paracervical musculature. He has a good range of motion of the neck through flexion and extension and lateral flexion to either side, without significant discomfort on range of motion neck. He does have a mildly positive Phalen's test in the left and a negative Phalen's test on the right. Motor examination shows 5 over 5 strength in the upper extremities including the deltoid biceps triceps and intrinsics and grip. Sensation is intact to pinprick throughout the digits of the upper extremities. Reflexes are symmetrical and without evidence of pathologic reflexes. Patient has a normal gait and stance.    Data Review:CBC    Component Value Date/Time   WBC 9.6 06/03/2011 1433   RBC  4.72 06/03/2011 1433   HGB 14.3 06/03/2011 1433   HCT 40.5 06/03/2011 1433   PLT 275 06/03/2011 1433   MCV 85.8 06/03/2011 1433   MCH 30.3 06/03/2011 1433   MCHC 35.3 06/03/2011 1433   RDW 13.1 06/03/2011 1433                          BMET    Component Value Date/Time   NA 140 06/03/2011 1433   K 3.8 06/03/2011 1433   CL 101 06/03/2011 1433   CO2 26 06/03/2011 1433   GLUCOSE 82 06/03/2011 1433   BUN 12 06/03/2011 1433   CREATININE 0.88 06/03/2011 1433   CALCIUM 10.1  06/03/2011 1433   GFRNONAA >90 06/03/2011 1433   GFRAA >90 06/03/2011 1433     Assessment/Plan: Patient was multilevel degenerative disease and spondylosis in the cervical spine with superimposed cervical disc herniations with significant resulting cervical stenosis and spinal cord compression with increased signal within the spinal cord consistent with cervical myelopathy. However examination shows normal reflexes and no pathologic reflexes.  Because of the severity of the spinal cord compression and altered signal from within the spinal cord it's felt that he should undergo surgical decompression via 3 level C3-4 C4-5 and C5-6 ACDF.I've discussed with the patient the nature of his condition, the nature the surgical procedure, the typical length of surgery, hospital stay, and overall recuperation. We discussed limitations postoperatively. I discussed risks of surgery including risks of infection, bleeding, possibly need for transfusion, the risk of nerve root dysfunction with pain, weakness, numbness, or paresthesias, the risk of spinal cord dysfunction with paralysis of all 4 limbs and quadriplegia, and the risk of dural tear and CSF leakage and possible need for further surgery, the risk of esophageal dysfunction causing dysphagia and the risk of laryngeal dysfunction causing hoarseness of the voice, the risk of failure of the arthrodesis and the possible need for further surgery, and the risk of anesthetic complications including myocardial infarction, stroke, pneumonia, and death. We also discussed the need for postoperative immobilization in a cervical collar. Understanding all this the patient does wish to proceed with surgery and is admitted for such.    Hewitt Shorts, MD 06/06/2011 4:31 PM

## 2011-06-07 ENCOUNTER — Encounter (HOSPITAL_COMMUNITY): Payer: Self-pay | Admitting: Neurosurgery

## 2011-06-07 MED ORDER — OXYCODONE-ACETAMINOPHEN 5-325 MG PO TABS: 1.0000 | ORAL_TABLET | ORAL | Status: AC | PRN

## 2011-06-07 MED ORDER — CYCLOBENZAPRINE HCL 10 MG PO TABS: 10.0000 mg | ORAL_TABLET | Freq: Three times a day (TID) | ORAL | Status: AC | PRN

## 2011-06-07 MED ORDER — SODIUM CHLORIDE 0.9 % IV SOLN: 250.0000 mL | INTRAVENOUS | Status: DC

## 2011-06-07 NOTE — Plan of Care (Signed)
Problem: Consults Goal: Diagnosis - Spinal Surgery Outcome: Completed/Met Date Met:  06/07/11 Cervical Spine Fusion     

## 2011-06-07 NOTE — Progress Notes (Signed)
Filed Vitals:   06/06/11 2200 06/07/11 0000 06/07/11 0400 06/07/11 0758  BP: 177/99 155/72 152/75 137/87  Pulse: 81 96 75 73  Temp: 98.2 F (36.8 C) 98.4 F (36.9 C) 98.4 F (36.9 C) 98.5 F (36.9 C)  TempSrc:  Oral Oral   Resp: 20 18 18 18   SpO2:  100% 100% 95%    Patient is doing well. His Foley was DC'd about 4 1/2 hours ago but he has not yet voided. Wound is clean and dry and healing well. He's been up and ambulating.   Plan: Have encouraged patient continue to ambulate actively, we'll monitor his urine output and followup with him later today.

## 2011-06-07 NOTE — Discharge Summary (Signed)
Physician Discharge Summary  Patient ID: Roger Fernandez MRN: [id number] DOB/AGE: 27-Apr-1956 55 y.o.  Admit date: 06/06/2011 Discharge date: 06/07/2011  Admission Diagnoses: Spondylitic cervical disc herniation with myelopathy, cervical spondylosis with myelopathy, cervical degenerative disc disease with myelopathy, cervical stenosis  Discharge Diagnoses: Spondylitic cervical disc herniation with myelopathy, cervical spondylosis with myelopathy, cervical degenerative disc disease with myelopathy cervical stenosis Active Problems:  * No active hospital problems. *    Discharged Condition: good  Hospital Course: Patient was admitted underwent a three-level C3-4 C4-5 and C5-6 ACDF. He has done well. His Foley catheter was discontinued this morning and he has voided well since then. He is up and ambulate actively in the halls.  Consults: none  Significant Diagnostic Studies: None  Treatments: None  Discharge Exam: Blood pressure 172/96, pulse 81, temperature 98.3 F (36.8 C), temperature source Oral, resp. rate 18, SpO2 97.00%. Wound is clean and dry no swelling erythema or drainage. Motor examination shows 5 over 5 strength in the upper extremities including the deltoid biceps triceps and intrinsics and grip. Sensation is intact to pinprick throughout the digits of the upper extremities. Reflexes are symmetrical and without evidence of pathologic reflexes. Patient has a normal gait and stance.   Disposition: Home   Current Discharge Medication List    START taking these medications   Details  cyclobenzaprine (FLEXERIL) 10 MG tablet Take 1 tablet (10 mg total) by mouth 3 (three) times daily as needed for muscle spasms. Qty: 50 tablet, Refills: 1    oxyCODONE-acetaminophen (PERCOCET) 5-325 MG per tablet Take 1-2 tablets by mouth every 4 (four) hours as needed for pain. Qty: 50 tablet, Refills: 0      CONTINUE these medications which have NOT CHANGED   Details  Benzocaine  (ORAJEL MT) Use as directed 1 application in the mouth or throat as needed. For ulcers.     lisinopril-hydrochlorothiazide (PRINZIDE,ZESTORETIC) 20-12.5 MG per tablet Take 1 tablet by mouth daily.      tadalafil (CIALIS) 10 MG tablet Take 10 mg by mouth daily as needed. For ED         Signed: Hewitt Shorts, MD 06/07/2011, 3:30 PM

## 2011-06-07 NOTE — Discharge Instructions (Signed)

## 2011-06-20 ENCOUNTER — Emergency Department (HOSPITAL_COMMUNITY): Payer: BC Managed Care – PPO

## 2011-06-20 ENCOUNTER — Encounter (HOSPITAL_COMMUNITY): Payer: Self-pay | Admitting: Nurse Practitioner

## 2011-06-20 ENCOUNTER — Emergency Department (HOSPITAL_COMMUNITY)
Admission: EM | Admit: 2011-06-20 | Discharge: 2011-06-20 | Disposition: A | Discharge status: Home / Self Care | Payer: BC Managed Care – PPO | Attending: Emergency Medicine | Admitting: Emergency Medicine

## 2011-06-20 DIAGNOSIS — R29898 Other symptoms and signs involving the musculoskeletal system: Secondary | ICD-10-CM | POA: Insufficient documentation

## 2011-06-20 DIAGNOSIS — I10 Essential (primary) hypertension: Secondary | ICD-10-CM | POA: Insufficient documentation

## 2011-06-20 DIAGNOSIS — Z9889 Other specified postprocedural states: Secondary | ICD-10-CM | POA: Insufficient documentation

## 2011-06-20 DIAGNOSIS — M5412 Radiculopathy, cervical region: Secondary | ICD-10-CM | POA: Insufficient documentation

## 2011-06-20 DIAGNOSIS — F17200 Nicotine dependence, unspecified, uncomplicated: Secondary | ICD-10-CM | POA: Insufficient documentation

## 2011-06-20 DIAGNOSIS — G542 Cervical root disorders, not elsewhere classified: Secondary | ICD-10-CM

## 2011-06-20 DIAGNOSIS — Z79899 Other long term (current) drug therapy: Secondary | ICD-10-CM | POA: Insufficient documentation

## 2011-06-20 DIAGNOSIS — M79609 Pain in unspecified limb: Secondary | ICD-10-CM | POA: Insufficient documentation

## 2011-06-20 NOTE — H&P (Signed)
Reason for Consult:Right arm weakness Referring Physician: Daymien Goth is an 55 y.o. male.   HPI: Patient had 3 level ACDF by Dr. Newell Coral on 06/06/2011 (C3/4, 4/5, 5/6) for severe cord compression and left radiculopathy.  Preoperatively, he had severe cord compression at C4/5 (4mm spinal canal, cord compression worse on right with increased cord signal).  Patient did well postoperatively, but on 12/24, began to develop right arm weakness.  He called and spoke to on-call NS doctor and was started on methylprednisolone.  He called back today and complained of progressively worsening weakness involving his entire right arm, despite steroids.  I advised him to come to ER for evaluation.  Past Medical History  Diagnosis Date  . Hypertension     DR Martha Clan PCP  DR [facility] CARDIAC    Past Surgical History  Procedure Date  . Tonsillectomy   . Appendectomy   . Anterior cervical decomp/discectomy fusion 06/06/2011    Procedure: ANTERIOR CERVICAL DECOMPRESSION/DISCECTOMY FUSION 3 LEVELS;  Surgeon: Hewitt Shorts;  Location: [facility]UR[facility] ORS;  Service: Neurosurgery;  Laterality: N/A;  Cervical Three-four,Cervical Four-Five,Cervical Five-Six anterior cervical decompression with fusion plating and bonegraft    History reviewed. No pertinent family history.  Social History:  reports that he has been smoking Cigars.  He does not have any smokeless tobacco history on file. He reports that he drinks alcohol. He reports that he does not use illicit drugs.  Allergies: No Known Allergies  Medications: I have reviewed the patient's current medications.  No results found for this or any previous visit (from the past 48 hour(s)).  Ct Cervical Spine Wo Contrast  06/20/2011  *RADIOLOGY REPORT*  Clinical Data: 55 year old male status post cervical spine surgery 06/06/2011.  Decreased range of motion and pain.  CT CERVICAL SPINE WITHOUT CONTRAST  Technique:  Multidetector CT imaging of the  cervical spine was performed. Multiplanar CT image reconstructions were also generated.  Comparison: Intraoperative radiographs 06/06/2011 and earlier.  Findings: Negative lung apices.  Negative visualized posterior fossa brain structures.  Satisfactory appearance of the paraspinal soft tissues.  Minimal postoperative changes to the left of midline.  No prevertebral or postoperative collection identified.  Visualized skull base is intact.  No atlanto-occipital dissociation.  Cervicothoracic junction alignment is within normal limits.  Bilateral posterior element alignment is within normal limits.  C2-C3:  Stable.  Mild facet and uncovertebral hypertrophy.  C3-C4:  Interval ACDF.  A single cortical screw is in place and appears intact.  Interbody implant in place with no adverse features.  Uncovertebral hypertrophy greater on the right primarily is responsible for foraminal stenosis.  AP thecal sac at this level is estimated at 7-8 mm.  C4-C5:  ACDF hardware. C4 cortical screw present to the left of midline and shows suboptimal bony purchase along the left lateral vertebral body. C5 cortical screw is without adverse features.  The interbody implant with no adverse features. There is mild right lateral listhesis at the level which may be new.  This is most apparent at the facet joints (series 402 image 46).  The right side facet is most diastatic.  Bilateral uncovertebral hypertrophy contribute to foraminal stenosis.  AP thecal sac at this level is estimated at 7 mm, versus 4 mm preoperatively.  C5-C6:  ACDF.  Bilateral C6 cortical screws are intact without adverse features.  Interbody implant without adverse features. Uncovertebral hypertrophy contributes to bilateral foraminal stenosis.  Estimated AP thecal sac 8 mm.  C6-C7:  Stable disc  space narrowing and circumferential endplate spurring.  C7-T1:  Mild facet and uncovertebral hypertrophy.  IMPRESSION: 1.  Interval ACDF.  No adverse postoperative soft tissue  findings. 2.  Hardware at the C4 vertebral body is suboptimally positioned. There is mild lateral listhesis at C4-C5 (series 402 image 46). Thecal sac patency appears improved from the preoperative study.  Original Report Authenticated By: Harley Hallmark, M.D.    Review of Systems - Negative except as noted    Blood pressure 135/87, pulse 79, temperature 98.4 F (36.9 C), temperature source Oral, resp. rate 18, height 6\' 1"  (1.854 m), weight 90.719 kg (200 lb), SpO2 97.00%. Incision C/D/I without erythema or swelling.  Dermabond in place. Full strength in all motor groups of upper and lower extremities with exception of right deltoid which is out and minimal weakness of right biceps.  Mild hyperreflexia with positive Hoffman's signs in fingers, normal reflexes in both legs.  Denies significant numbness or pain.  Assessment/Plan:  Isolated delayed right C5 radiculopathy after 3 level ACDF.  This does not appear to affect any other motor groups.  CT C-spine shows well-positioned bone grafts and plate/screws with significant improvement in AP diameter at each level, but paricularly at C4/5.  I reassured patient that this should improve with time.  I will send him home on decadron 4mg  TID for the next four days, along with Protonix, with instructions to follow up with Dr. Newell Coral at the end of this week.  Patient knows to mobilize his shoulder (I explained exercises) and to call or return to ER if weakness progresses or begins to involve left side.  Dorian Heckle, MD 06/20/2011, 8:20 PM

## 2011-06-20 NOTE — ED Notes (Signed)
Pt seen by MD Venetia Maxon and consulted to f/u x1week. Pt states understanding of discharge instructions and denies questions. MD stern gave Rx for Decadron and Protonix (pt understands administration instructions and denies questions). Pt NAD, resp e/u, AOx4, ambulatory with steady gait at time of discharge.

## 2011-06-20 NOTE — ED Provider Notes (Signed)
I saw pt primarily and care was continued in the CDU.  I reviewed and agree with the PA care.  Caree Wolpert, MD 06/20/11 2341 

## 2011-06-20 NOTE — ED Notes (Signed)
States unable to elevate R arm up since 12/24. Reports spinal surgery on 12/17. Spoke to surgeon and instructed pt to come to ER, Dr Venetia Maxon.

## 2011-06-20 NOTE — ED Provider Notes (Addendum)
History     CSN: [id number]  Arrival date & time 06/20/11  1512   First MD Initiated Contact with Patient 06/20/11 1903      Chief Complaint  Patient presents with  . Arm Pain    (Consider location/radiation/quality/duration/timing/severity/associated sxs/prior treatment) HPI Comments: Neck surgery done 2 weeks ago and for the last one week has had progressive weakness of the right arm. No particular pain.  Patient is a 55 y.o. male presenting with extremity weakness. The history is provided by the patient.  Extremity Weakness This is a new problem. Episode onset: Since Christmas Eve. The problem occurs constantly. The problem has been gradually worsening. Associated symptoms comments: No pain or numbness just shoulder weakness and now is unable to eat with her right hand or comb his hair.. The symptoms are aggravated by nothing. The symptoms are relieved by nothing. He has tried nothing for the symptoms. The treatment provided no relief.    Past Medical History  Diagnosis Date  . Hypertension     DR Martha Clan PCP  DR [facility] CARDIAC    Past Surgical History  Procedure Date  . Tonsillectomy   . Appendectomy   . Anterior cervical decomp/discectomy fusion 06/06/2011    Procedure: ANTERIOR CERVICAL DECOMPRESSION/DISCECTOMY FUSION 3 LEVELS;  Surgeon: Hewitt Shorts;  Location: [facility] NEURO ORS;  Service: Neurosurgery;  Laterality: N/A;  Cervical Three-four,Cervical Four-Five,Cervical Five-Six anterior cervical decompression with fusion plating and bonegraft    History reviewed. No pertinent family history.  History  Substance Use Topics  . Smoking status: Current Some Day Smoker    Types: Cigars  . Smokeless tobacco: Not on file  . Alcohol Use: Yes     OCC.      Review of Systems  Musculoskeletal: Positive for extremity weakness.  All other systems reviewed and are negative.    Allergies  Review of patient's allergies indicates no known allergies.  Home  Medications   Current Outpatient Rx  Name Route Sig Dispense Refill  . LISINOPRIL-HYDROCHLOROTHIAZIDE 20-12.5 MG PO TABS Oral Take 1 tablet by mouth daily.     . METHYLPREDNISOLONE 4 MG PO KIT Oral Take 4 mg by mouth See admin instructions. follow package directions     . OXYCODONE-ACETAMINOPHEN 5-325 MG PO TABS Oral Take 1 tablet by mouth every 6 (six) hours as needed. For pain     . TADALAFIL 10 MG PO TABS Oral Take 10 mg by mouth daily as needed. For ED      BP 128/78  Pulse 87  Temp(Src) 99 F (37.2 C) (Oral)  Resp 16  Ht 6\' 1"  (1.854 m)  Wt 200 lb (90.719 kg)  BMI 26.39 kg/m2  SpO2 98%  Physical Exam  Nursing note and vitals reviewed. Constitutional: He is oriented to person, place, and time. He appears well-developed and well-nourished. No distress.  HENT:  Head: Normocephalic and atraumatic.  Mouth/Throat: Oropharynx is clear and moist.  Eyes: Conjunctivae and EOM are normal. Pupils are equal, round, and reactive to light.  Neck: Neck supple.       Soft collar present  Cardiovascular: Normal rate, regular rhythm and intact distal pulses.   No murmur heard. Pulmonary/Chest: Effort normal and breath sounds normal. No respiratory distress. He has no wheezes. He has no rales.  Musculoskeletal: Normal range of motion. He exhibits no edema and no tenderness.  Neurological: He is alert and oriented to person, place, and time. No sensory deficit.       Deltoid strength  on the right 0-1/5. 5 out of 5 strength of the bicep, tricep, hand  Skin: Skin is warm and dry. No rash noted. No erythema.  Psychiatric: He has a normal mood and affect. His behavior is normal.    ED Course  Procedures (including critical care time)  Labs Reviewed - No data to display Ct Cervical Spine Wo Contrast  06/20/2011  *RADIOLOGY REPORT*  Clinical Data: 55 year old male status post cervical spine surgery 06/06/2011.  Decreased range of motion and pain.  CT CERVICAL SPINE WITHOUT CONTRAST  Technique:   Multidetector CT imaging of the cervical spine was performed. Multiplanar CT image reconstructions were also generated.  Comparison: Intraoperative radiographs 06/06/2011 and earlier.  Findings: Negative lung apices.  Negative visualized posterior fossa brain structures.  Satisfactory appearance of the paraspinal soft tissues.  Minimal postoperative changes to the left of midline.  No prevertebral or postoperative collection identified.  Visualized skull base is intact.  No atlanto-occipital dissociation.  Cervicothoracic junction alignment is within normal limits.  Bilateral posterior element alignment is within normal limits.  C2-C3:  Stable.  Mild facet and uncovertebral hypertrophy.  C3-C4:  Interval ACDF.  A single cortical screw is in place and appears intact.  Interbody implant in place with no adverse features.  Uncovertebral hypertrophy greater on the right primarily is responsible for foraminal stenosis.  AP thecal sac at this level is estimated at 7-8 mm.  C4-C5:  ACDF hardware. C4 cortical screw present to the left of midline and shows suboptimal bony purchase along the left lateral vertebral body. C5 cortical screw is without adverse features.  The interbody implant with no adverse features. There is mild right lateral listhesis at the level which may be new.  This is most apparent at the facet joints (series 402 image 46).  The right side facet is most diastatic.  Bilateral uncovertebral hypertrophy contribute to foraminal stenosis.  AP thecal sac at this level is estimated at 7 mm, versus 4 mm preoperatively.  C5-C6:  ACDF.  Bilateral C6 cortical screws are intact without adverse features.  Interbody implant without adverse features. Uncovertebral hypertrophy contributes to bilateral foraminal stenosis.  Estimated AP thecal sac 8 mm.  C6-C7:  Stable disc space narrowing and circumferential endplate spurring.  C7-T1:  Mild facet and uncovertebral hypertrophy.  IMPRESSION: 1.  Interval ACDF.  No  adverse postoperative soft tissue findings. 2.  Hardware at the C4 vertebral body is suboptimally positioned. There is mild lateral listhesis at C4-C5 (series 402 image 46). Thecal sac patency appears improved from the preoperative study.  Original Report Authenticated By: Harley Hallmark, M.D.     No diagnosis found.    MDM   Patient had neck surgery 2 weeks ago and Christmas Eve started noting weakness in his right arm progressing to where now he is unable to raise his right arm. Arm can be fully ranged with passive range of motion however when arm was develop he is unable to hold it. Normal handgrip, bicep and tricep strength. Weakness appears to be in the deltoid early. Patient states he's starting to feel mild weakness on the left as well but currently it has normal strength. Spoke with Dr. Venetia Maxon and will get a CT of his neck for further evaluation.    8:12 PM CT done in Dr. Venetia Maxon coming to see patient    Gwyneth Sprout, MD 06/20/11 2012  Gwyneth Sprout, MD 06/20/11 2012

## 2011-06-20 NOTE — ED Provider Notes (Signed)
Patient seen by Dr. Venetia Maxon.  Dr. Venetia Maxon provided patient with prescriptions and follow-up instructions.  Jimmye Norman, NP 06/20/11 [phone]

## 2011-06-20 NOTE — ED Notes (Signed)
Paged dr Venetia Maxon to notify of pt arrival in ed

## 2012-03-12 ENCOUNTER — Other Ambulatory Visit: Payer: Self-pay | Admitting: Neurosurgery

## 2012-03-12 DIAGNOSIS — M5000 Cervical disc disorder with myelopathy, unspecified cervical region: Secondary | ICD-10-CM

## 2012-03-12 DIAGNOSIS — M4802 Spinal stenosis, cervical region: Secondary | ICD-10-CM

## 2012-03-12 DIAGNOSIS — M4712 Other spondylosis with myelopathy, cervical region: Secondary | ICD-10-CM

## 2012-03-15 ENCOUNTER — Ambulatory Visit
Admission: RE | Admit: 2012-03-15 | Discharge: 2012-03-15 | Disposition: A | Discharge status: Home / Self Care | Payer: BC Managed Care – PPO | Source: Ambulatory Visit | Attending: Neurosurgery | Admitting: Neurosurgery

## 2012-03-15 DIAGNOSIS — M4802 Spinal stenosis, cervical region: Secondary | ICD-10-CM

## 2012-03-15 DIAGNOSIS — M4712 Other spondylosis with myelopathy, cervical region: Secondary | ICD-10-CM

## 2012-03-15 DIAGNOSIS — M5000 Cervical disc disorder with myelopathy, unspecified cervical region: Secondary | ICD-10-CM

## 2012-06-27 ENCOUNTER — Other Ambulatory Visit: Payer: Self-pay | Admitting: Neurosurgery

## 2012-06-27 DIAGNOSIS — M5000 Cervical disc disorder with myelopathy, unspecified cervical region: Secondary | ICD-10-CM

## 2012-06-27 DIAGNOSIS — M4802 Spinal stenosis, cervical region: Secondary | ICD-10-CM

## 2012-06-27 DIAGNOSIS — M4712 Other spondylosis with myelopathy, cervical region: Secondary | ICD-10-CM

## 2012-07-17 ENCOUNTER — Other Ambulatory Visit: Payer: BC Managed Care – PPO

## 2012-07-18 ENCOUNTER — Other Ambulatory Visit: Payer: BC Managed Care – PPO

## 2012-07-19 ENCOUNTER — Ambulatory Visit
Admission: RE | Admit: 2012-07-19 | Discharge: 2012-07-19 | Disposition: A | Discharge status: Home / Self Care | Payer: BC Managed Care – PPO | Source: Ambulatory Visit | Attending: Neurosurgery | Admitting: Neurosurgery

## 2012-07-19 DIAGNOSIS — M4712 Other spondylosis with myelopathy, cervical region: Secondary | ICD-10-CM

## 2012-07-19 DIAGNOSIS — M4802 Spinal stenosis, cervical region: Secondary | ICD-10-CM

## 2012-07-19 DIAGNOSIS — M5000 Cervical disc disorder with myelopathy, unspecified cervical region: Secondary | ICD-10-CM

## 2014-10-23 ENCOUNTER — Encounter: Payer: Self-pay | Admitting: Gastroenterology

## 2015-01-23 ENCOUNTER — Encounter: Payer: Self-pay | Admitting: Gastroenterology

## 2015-10-07 ENCOUNTER — Other Ambulatory Visit: Payer: Self-pay | Admitting: Gastroenterology

## 2015-10-07 DIAGNOSIS — K640 First degree hemorrhoids: Secondary | ICD-10-CM | POA: Diagnosis not present

## 2015-10-07 DIAGNOSIS — D126 Benign neoplasm of colon, unspecified: Secondary | ICD-10-CM | POA: Diagnosis not present

## 2015-10-07 DIAGNOSIS — D127 Benign neoplasm of rectosigmoid junction: Secondary | ICD-10-CM | POA: Diagnosis not present

## 2015-10-07 DIAGNOSIS — K621 Rectal polyp: Secondary | ICD-10-CM | POA: Diagnosis not present

## 2015-10-07 DIAGNOSIS — K5730 Diverticulosis of large intestine without perforation or abscess without bleeding: Secondary | ICD-10-CM | POA: Diagnosis not present

## 2015-10-07 DIAGNOSIS — D125 Benign neoplasm of sigmoid colon: Secondary | ICD-10-CM | POA: Diagnosis not present

## 2015-10-07 DIAGNOSIS — D122 Benign neoplasm of ascending colon: Secondary | ICD-10-CM | POA: Diagnosis not present

## 2015-10-07 DIAGNOSIS — R194 Change in bowel habit: Secondary | ICD-10-CM | POA: Diagnosis not present

## 2016-01-21 DIAGNOSIS — E291 Testicular hypofunction: Secondary | ICD-10-CM | POA: Diagnosis not present

## 2016-01-21 DIAGNOSIS — I10 Essential (primary) hypertension: Secondary | ICD-10-CM | POA: Diagnosis not present

## 2016-01-21 DIAGNOSIS — E119 Type 2 diabetes mellitus without complications: Secondary | ICD-10-CM | POA: Diagnosis not present

## 2016-05-26 DIAGNOSIS — M7711 Lateral epicondylitis, right elbow: Secondary | ICD-10-CM | POA: Diagnosis not present

## 2016-05-26 DIAGNOSIS — Z23 Encounter for immunization: Secondary | ICD-10-CM | POA: Diagnosis not present

## 2016-05-26 DIAGNOSIS — E119 Type 2 diabetes mellitus without complications: Secondary | ICD-10-CM | POA: Diagnosis not present

## 2016-05-26 DIAGNOSIS — Z1389 Encounter for screening for other disorder: Secondary | ICD-10-CM | POA: Diagnosis not present

## 2016-05-26 DIAGNOSIS — I10 Essential (primary) hypertension: Secondary | ICD-10-CM | POA: Diagnosis not present

## 2016-05-26 DIAGNOSIS — E784 Other hyperlipidemia: Secondary | ICD-10-CM | POA: Diagnosis not present

## 2016-08-10 DIAGNOSIS — I10 Essential (primary) hypertension: Secondary | ICD-10-CM | POA: Diagnosis not present

## 2016-08-10 DIAGNOSIS — E291 Testicular hypofunction: Secondary | ICD-10-CM | POA: Diagnosis not present

## 2016-08-10 DIAGNOSIS — E119 Type 2 diabetes mellitus without complications: Secondary | ICD-10-CM | POA: Diagnosis not present

## 2016-08-10 DIAGNOSIS — Z125 Encounter for screening for malignant neoplasm of prostate: Secondary | ICD-10-CM | POA: Diagnosis not present

## 2016-08-17 DIAGNOSIS — E291 Testicular hypofunction: Secondary | ICD-10-CM | POA: Diagnosis not present

## 2016-08-17 DIAGNOSIS — Z125 Encounter for screening for malignant neoplasm of prostate: Secondary | ICD-10-CM | POA: Diagnosis not present

## 2016-08-17 DIAGNOSIS — E119 Type 2 diabetes mellitus without complications: Secondary | ICD-10-CM | POA: Diagnosis not present

## 2016-08-17 DIAGNOSIS — I10 Essential (primary) hypertension: Secondary | ICD-10-CM | POA: Diagnosis not present

## 2016-08-17 DIAGNOSIS — E784 Other hyperlipidemia: Secondary | ICD-10-CM | POA: Diagnosis not present

## 2016-08-17 DIAGNOSIS — Z0000 Encounter for general adult medical examination without abnormal findings: Secondary | ICD-10-CM | POA: Diagnosis not present

## 2016-08-17 DIAGNOSIS — Z1389 Encounter for screening for other disorder: Secondary | ICD-10-CM | POA: Diagnosis not present

## 2017-03-16 DIAGNOSIS — E119 Type 2 diabetes mellitus without complications: Secondary | ICD-10-CM | POA: Diagnosis not present

## 2017-03-16 DIAGNOSIS — I10 Essential (primary) hypertension: Secondary | ICD-10-CM | POA: Diagnosis not present

## 2017-03-16 DIAGNOSIS — E784 Other hyperlipidemia: Secondary | ICD-10-CM | POA: Diagnosis not present

## 2017-03-16 DIAGNOSIS — E291 Testicular hypofunction: Secondary | ICD-10-CM | POA: Diagnosis not present

## 2017-05-02 DIAGNOSIS — E119 Type 2 diabetes mellitus without complications: Secondary | ICD-10-CM | POA: Diagnosis not present

## 2017-09-25 DIAGNOSIS — R82998 Other abnormal findings in urine: Secondary | ICD-10-CM | POA: Diagnosis not present

## 2017-09-25 DIAGNOSIS — E119 Type 2 diabetes mellitus without complications: Secondary | ICD-10-CM | POA: Diagnosis not present

## 2017-09-25 DIAGNOSIS — Z0000 Encounter for general adult medical examination without abnormal findings: Secondary | ICD-10-CM | POA: Diagnosis not present

## 2017-09-25 DIAGNOSIS — Z125 Encounter for screening for malignant neoplasm of prostate: Secondary | ICD-10-CM | POA: Diagnosis not present

## 2017-10-02 DIAGNOSIS — Z1389 Encounter for screening for other disorder: Secondary | ICD-10-CM | POA: Diagnosis not present

## 2017-10-02 DIAGNOSIS — E291 Testicular hypofunction: Secondary | ICD-10-CM | POA: Diagnosis not present

## 2017-10-02 DIAGNOSIS — Z0000 Encounter for general adult medical examination without abnormal findings: Secondary | ICD-10-CM | POA: Diagnosis not present

## 2017-10-02 DIAGNOSIS — I10 Essential (primary) hypertension: Secondary | ICD-10-CM | POA: Diagnosis not present

## 2017-10-02 DIAGNOSIS — E7849 Other hyperlipidemia: Secondary | ICD-10-CM | POA: Diagnosis not present

## 2017-10-02 DIAGNOSIS — E119 Type 2 diabetes mellitus without complications: Secondary | ICD-10-CM | POA: Diagnosis not present

## 2018-04-04 DIAGNOSIS — E291 Testicular hypofunction: Secondary | ICD-10-CM | POA: Diagnosis not present

## 2018-04-04 DIAGNOSIS — E7849 Other hyperlipidemia: Secondary | ICD-10-CM | POA: Diagnosis not present

## 2018-04-04 DIAGNOSIS — I10 Essential (primary) hypertension: Secondary | ICD-10-CM | POA: Diagnosis not present

## 2018-04-04 DIAGNOSIS — E119 Type 2 diabetes mellitus without complications: Secondary | ICD-10-CM | POA: Diagnosis not present

## 2018-04-25 DIAGNOSIS — H900 Conductive hearing loss, bilateral: Secondary | ICD-10-CM | POA: Diagnosis not present

## 2018-04-25 DIAGNOSIS — J343 Hypertrophy of nasal turbinates: Secondary | ICD-10-CM | POA: Diagnosis not present

## 2018-04-25 DIAGNOSIS — Z6829 Body mass index (BMI) 29.0-29.9, adult: Secondary | ICD-10-CM | POA: Diagnosis not present

## 2018-04-25 DIAGNOSIS — H6123 Impacted cerumen, bilateral: Secondary | ICD-10-CM | POA: Diagnosis not present

## 2018-04-25 DIAGNOSIS — H60392 Other infective otitis externa, left ear: Secondary | ICD-10-CM | POA: Diagnosis not present

## 2018-05-04 DIAGNOSIS — H60331 Swimmer's ear, right ear: Secondary | ICD-10-CM | POA: Diagnosis not present

## 2018-10-08 DIAGNOSIS — E7849 Other hyperlipidemia: Secondary | ICD-10-CM | POA: Diagnosis not present

## 2018-10-08 DIAGNOSIS — E119 Type 2 diabetes mellitus without complications: Secondary | ICD-10-CM | POA: Diagnosis not present

## 2018-10-08 DIAGNOSIS — Z125 Encounter for screening for malignant neoplasm of prostate: Secondary | ICD-10-CM | POA: Diagnosis not present

## 2018-10-11 DIAGNOSIS — R82998 Other abnormal findings in urine: Secondary | ICD-10-CM | POA: Diagnosis not present

## 2018-10-11 DIAGNOSIS — I10 Essential (primary) hypertension: Secondary | ICD-10-CM | POA: Diagnosis not present

## 2018-10-15 DIAGNOSIS — Z0000 Encounter for general adult medical examination without abnormal findings: Secondary | ICD-10-CM | POA: Diagnosis not present

## 2018-10-15 DIAGNOSIS — E119 Type 2 diabetes mellitus without complications: Secondary | ICD-10-CM | POA: Diagnosis not present

## 2018-10-15 DIAGNOSIS — E785 Hyperlipidemia, unspecified: Secondary | ICD-10-CM | POA: Diagnosis not present

## 2018-10-15 DIAGNOSIS — E291 Testicular hypofunction: Secondary | ICD-10-CM | POA: Diagnosis not present

## 2018-10-15 DIAGNOSIS — Z1331 Encounter for screening for depression: Secondary | ICD-10-CM | POA: Diagnosis not present

## 2018-10-15 DIAGNOSIS — I10 Essential (primary) hypertension: Secondary | ICD-10-CM | POA: Diagnosis not present

## 2018-12-28 DIAGNOSIS — Z20828 Contact with and (suspected) exposure to other viral communicable diseases: Secondary | ICD-10-CM | POA: Diagnosis not present

## 2019-10-24 ENCOUNTER — Other Ambulatory Visit: Payer: Self-pay | Admitting: Internal Medicine

## 2019-10-24 DIAGNOSIS — F17290 Nicotine dependence, other tobacco product, uncomplicated: Secondary | ICD-10-CM

## 2019-11-04 ENCOUNTER — Ambulatory Visit
Admission: RE | Admit: 2019-11-04 | Discharge: 2019-11-04 | Disposition: A | Discharge status: Home / Self Care | Payer: Medicare Other | Source: Ambulatory Visit | Attending: Internal Medicine | Admitting: Internal Medicine

## 2019-11-04 DIAGNOSIS — F17290 Nicotine dependence, other tobacco product, uncomplicated: Secondary | ICD-10-CM

## 2019-12-27 ENCOUNTER — Other Ambulatory Visit: Payer: Self-pay

## 2019-12-27 ENCOUNTER — Emergency Department (HOSPITAL_COMMUNITY): Payer: Medicare Other

## 2019-12-27 ENCOUNTER — Emergency Department (HOSPITAL_BASED_OUTPATIENT_CLINIC_OR_DEPARTMENT_OTHER): Payer: Medicare Other

## 2019-12-27 ENCOUNTER — Observation Stay (HOSPITAL_COMMUNITY)
Admission: EM | Admit: 2019-12-27 | Discharge: 2019-12-28 | Disposition: A | Discharge status: Home / Self Care | Payer: Medicare Other | Attending: Family Medicine | Admitting: Family Medicine

## 2019-12-27 ENCOUNTER — Encounter (HOSPITAL_COMMUNITY): Payer: Self-pay | Admitting: Internal Medicine

## 2019-12-27 DIAGNOSIS — L03116 Cellulitis of left lower limb: Principal | ICD-10-CM | POA: Diagnosis present

## 2019-12-27 DIAGNOSIS — Z7984 Long term (current) use of oral hypoglycemic drugs: Secondary | ICD-10-CM | POA: Diagnosis not present

## 2019-12-27 DIAGNOSIS — E119 Type 2 diabetes mellitus without complications: Secondary | ICD-10-CM

## 2019-12-27 DIAGNOSIS — I10 Essential (primary) hypertension: Secondary | ICD-10-CM | POA: Insufficient documentation

## 2019-12-27 DIAGNOSIS — Y929 Unspecified place or not applicable: Secondary | ICD-10-CM | POA: Insufficient documentation

## 2019-12-27 DIAGNOSIS — R609 Edema, unspecified: Secondary | ICD-10-CM

## 2019-12-27 DIAGNOSIS — Y999 Unspecified external cause status: Secondary | ICD-10-CM | POA: Insufficient documentation

## 2019-12-27 DIAGNOSIS — Z20822 Contact with and (suspected) exposure to covid-19: Secondary | ICD-10-CM | POA: Diagnosis not present

## 2019-12-27 DIAGNOSIS — S8012XA Contusion of left lower leg, initial encounter: Secondary | ICD-10-CM | POA: Insufficient documentation

## 2019-12-27 DIAGNOSIS — R0781 Pleurodynia: Secondary | ICD-10-CM | POA: Diagnosis not present

## 2019-12-27 DIAGNOSIS — E785 Hyperlipidemia, unspecified: Secondary | ICD-10-CM | POA: Diagnosis present

## 2019-12-27 DIAGNOSIS — R600 Localized edema: Secondary | ICD-10-CM

## 2019-12-27 DIAGNOSIS — Z79899 Other long term (current) drug therapy: Secondary | ICD-10-CM | POA: Diagnosis not present

## 2019-12-27 DIAGNOSIS — F17290 Nicotine dependence, other tobacco product, uncomplicated: Secondary | ICD-10-CM | POA: Insufficient documentation

## 2019-12-27 DIAGNOSIS — E1169 Type 2 diabetes mellitus with other specified complication: Secondary | ICD-10-CM | POA: Diagnosis present

## 2019-12-27 DIAGNOSIS — R0789 Other chest pain: Secondary | ICD-10-CM | POA: Insufficient documentation

## 2019-12-27 DIAGNOSIS — E1159 Type 2 diabetes mellitus with other circulatory complications: Secondary | ICD-10-CM | POA: Diagnosis present

## 2019-12-27 DIAGNOSIS — S80812A Abrasion, left lower leg, initial encounter: Secondary | ICD-10-CM | POA: Diagnosis present

## 2019-12-27 DIAGNOSIS — Y939 Activity, unspecified: Secondary | ICD-10-CM | POA: Insufficient documentation

## 2019-12-27 DIAGNOSIS — S8992XA Unspecified injury of left lower leg, initial encounter: Secondary | ICD-10-CM | POA: Diagnosis not present

## 2019-12-27 DIAGNOSIS — I152 Hypertension secondary to endocrine disorders: Secondary | ICD-10-CM | POA: Diagnosis present

## 2019-12-27 HISTORY — DX: Type 2 diabetes mellitus without complications: E11.9

## 2019-12-27 HISTORY — DX: Type 2 diabetes mellitus with other specified complication: E11.69

## 2019-12-27 LAB — BASIC METABOLIC PANEL
Anion gap: 11 (ref 5–15)
BUN: 15 mg/dL (ref 8–23)
CO2: 23 mmol/L (ref 22–32)
Calcium: 9.5 mg/dL (ref 8.9–10.3)
Chloride: 102 mmol/L (ref 98–111)
Creatinine, Ser: 0.95 mg/dL (ref 0.61–1.24)
GFR calc Af Amer: >60 mL/min (ref >60)
GFR calc non Af Amer: >60 mL/min (ref >60)
Glucose, Bld: 88 mg/dL (ref 70–99)
Potassium: 4.1 mmol/L (ref 3.5–5.1)
Sodium: 136 mmol/L (ref 135–145)

## 2019-12-27 LAB — CBC
HCT: 41 % (ref 39.0–52.0)
Hemoglobin: 13.7 g/dL (ref 13.0–17.0)
MCH: 29.5 pg (ref 26.0–34.0)
MCHC: 33.4 g/dL (ref 30.0–36.0)
MCV: 88.2 fL (ref 80.0–100.0)
Platelets: 302 10*3/uL (ref 150–400)
RBC: 4.65 MIL/uL (ref 4.22–5.81)
RDW: 13 % (ref 11.5–15.5)
WBC: 11.3 10*3/uL — ABNORMAL HIGH (ref 4.0–10.5)
nRBC: 0 % (ref 0.0–0.2)

## 2019-12-27 MED ORDER — CEFAZOLIN SODIUM-DEXTROSE 1-4 GM/50ML-% IV SOLN
1.0000 g | Freq: Once | INTRAVENOUS | Status: AC
  Administered 2019-12-27: 1 g via INTRAVENOUS
  Filled 2019-12-27: qty 50

## 2019-12-27 MED ORDER — ATORVASTATIN CALCIUM 10 MG PO TABS
20.0000 mg | ORAL_TABLET | Freq: Every day | ORAL | Status: DC
  Administered 2019-12-28: 20 mg via ORAL
  Filled 2019-12-27: qty 2

## 2019-12-27 MED ORDER — ACETAMINOPHEN 650 MG RE SUPP: 650.0000 mg | Freq: Four times a day (QID) | RECTAL | Status: DC | PRN

## 2019-12-27 MED ORDER — CEFAZOLIN SODIUM-DEXTROSE 1-4 GM/50ML-% IV SOLN
1.0000 g | Freq: Three times a day (TID) | INTRAVENOUS | Status: DC
  Administered 2019-12-28: 1 g via INTRAVENOUS
  Filled 2019-12-27: qty 50

## 2019-12-27 MED ORDER — LISINOPRIL 20 MG PO TABS
20.0000 mg | ORAL_TABLET | Freq: Every day | ORAL | Status: DC
  Administered 2019-12-28: 20 mg via ORAL
  Filled 2019-12-27: qty 1

## 2019-12-27 MED ORDER — ACETAMINOPHEN 325 MG PO TABS: 650.0000 mg | ORAL_TABLET | Freq: Four times a day (QID) | ORAL | Status: DC | PRN

## 2019-12-27 MED ORDER — HYDROCHLOROTHIAZIDE 12.5 MG PO CAPS
12.5000 mg | ORAL_CAPSULE | Freq: Every day | ORAL | Status: DC
  Administered 2019-12-28: 12.5 mg via ORAL
  Filled 2019-12-27: qty 1

## 2019-12-27 MED ORDER — METFORMIN HCL 500 MG PO TABS
500.0000 mg | ORAL_TABLET | Freq: Two times a day (BID) | ORAL | Status: DC
  Administered 2019-12-28: 500 mg via ORAL
  Filled 2019-12-27: qty 1

## 2019-12-27 MED ORDER — ENOXAPARIN SODIUM 40 MG/0.4ML ~~LOC~~ SOLN
40.0000 mg | Freq: Every day | SUBCUTANEOUS | Status: DC
  Administered 2019-12-28: 40 mg via SUBCUTANEOUS
  Filled 2019-12-27: qty 0.4

## 2019-12-27 MED ORDER — IBUPROFEN 200 MG PO TABS: 600.0000 mg | ORAL_TABLET | Freq: Four times a day (QID) | ORAL | Status: DC | PRN

## 2019-12-27 MED ORDER — LISINOPRIL-HYDROCHLOROTHIAZIDE 20-12.5 MG PO TABS: 1.0000 | ORAL_TABLET | Freq: Every day | ORAL | Status: DC

## 2019-12-27 NOTE — Progress Notes (Signed)
Lower extremity venous has been completed.   Preliminary results in CV Proc.   Blanch Media 12/27/2019 6:36 PM

## 2019-12-27 NOTE — H&P (Signed)
History and Physical    Roger Fernandez WUJ:[id number] DOB: 19-Feb-1956 DOA: 12/27/2019  PCP: Martha Clan, MD  Patient coming from: Home  I have personally briefly reviewed patient's old medical records in [facility]  Chief Complaint: Left leg swelling after injury  HPI: Roger Fernandez is a 64 y.o. male with medical history significant for type 2 diabetes, hypertension, hyperlipidemia, cervical disc disease s/p ACDF who presents to the ED for evaluation of left leg swelling and pain.  Patient states he was riding a horse on 12/21/2019 when the horse stepped in a hole and fell.  The horse landed on the patient on his left lower leg.  Since then he has been having progressive swelling and pain at the left lower leg halfway between the ankle and knee.  He did not see any open wound or discharge.  He has had some chills but denies any subjective fevers or diaphoresis.  He denies any chest pain, dyspnea, nausea, vomiting, abdominal pain, dysuria, or diarrhea.  He can try to manage this conservatively at home without improvement.  ED Course:  Initial vitals showed BP 150/70, pulse 90, RR 18, temp 98.3 Fahrenheit, SPO2 99% on room air.  Labs notable for WBC 11.3, hemoglobin 13.7, platelets 302,000, sodium 136, potassium 4.1, bicarb 23, BUN 15, creatinine 0.95, serum glucose 88.  SARS-CoV-2 PCR is collected and pending.  Left rib x-ray are negative for fracture or other bone lesions.  Left tibia/fibula x-ray is negative for evidence of fracture or other focal bone lesions.  Left lower extremity venous Doppler was performed.  Preliminary read is negative for evidence of DVT or cystic structure in the popliteal fossa.  Patient was given IV cefazolin and the hospitalist service was consulted to admit for further evaluation management.  Review of Systems: All systems reviewed and are negative except as documented in history of present illness above.   Past Medical History:  Diagnosis Date    . Hyperlipidemia associated with type 2 diabetes mellitus (HCC)   . Hypertension    DR Martha Clan PCP  DR [facility] CARDI[facility]  . Type 2 diabetes mellitus (HCC)     Past Surgical History:  Procedure Laterality Date  . ANTERIOR CERVICAL DECOMP/DISCECTOMY FUSION  06/06/2011   Procedure: ANTERIOR CERVICAL DECOMPRESSION/DISCECTOMY FUSION 3 LEVELS;  Surgeon: Hewitt Shorts;  Location: [facility];  Service: Neurosurgery;  Laterality: N/A;  Cervical Three-four,Cervical Four-Five,Cervical Five-Six anterior cervical decompression with fusion plating and bonegraft  . APPENDECTOMY    . TONSILLECTOMY      Social History:  reports that he has been smoking cigars. He does not have any smokeless tobacco history on file. He reports current alcohol use. He reports that he does not use drugs.  No Known Allergies  No family history on file.   Prior to Admission medications   Medication Sig Start Date End Date Taking? Authorizing Provider  Apoaequorin (PREVAGEN PO) Take 1 capsule by mouth daily.   Yes [provider]  aspirin EC 81 MG tablet Take 81 mg by mouth daily. Swallow whole.   Yes [provider]  atorvastatin (LIPITOR) 20 MG tablet Take 20 mg by mouth daily. 12/21/19  Yes [provider]  [place] 450 MG CAPS Take 450 mg by mouth at bedtime.   Yes [provider]  diphenhydramine-acetaminophen (TYLENOL PM) 25-500 MG TABS tablet Take 1 tablet by mouth at bedtime as needed (sleep.).   Yes [provider]  lisinopril-hydrochlorothiazide (PRINZIDE,ZESTORETIC) 20-12.5 MG  per tablet Take 1 tablet by mouth daily.    Yes [provider]  metFORMIN (GLUCOPHAGE) 500 MG tablet Take 500 mg by mouth 2 (two) times daily. 12/21/19  Yes [provider]  PRESCRIPTION MEDICATION Apply 1 application topically daily. Testosterone compounded cream.   Yes [provider]  Valerian 500 MG CAPS Take 500 mg by mouth daily as needed  (calming.).   Yes [provider]  methylPREDNISolone (MEDROL DOSEPAK) 4 MG tablet Take 4 mg by mouth See admin instructions. follow package directions  Patient not taking: Reported on 12/27/2019    [provider]  oxyCODONE-acetaminophen (PERCOCET) 5-325 MG per tablet Take 1 tablet by mouth every 6 (six) hours as needed. For pain  Patient not taking: Reported on 12/27/2019    [provider]  tadalafil (CIALIS) 10 MG tablet Take 10 mg by mouth daily as needed. For ED Patient not taking: Reported on 12/27/2019    [provider]    Physical Exam: Vitals:   12/27/19 1806 12/27/19 1930 12/27/19 2215 12/27/19 2243  BP:  (!) 143/83 136/79 (!) 147/77  Pulse: 81 76 79 79  Resp:  18 18   Temp:      TempSrc:      SpO2: 99% 98% 99% 98%  Weight:      Height:       Constitutional: Resting supine in bed, NAD, calm, comfortable Eyes: PERRL, lids and conjunctivae normal ENMT: Mucous membranes are moist. Posterior pharynx clear of any exudate or lesions.Normal dentition.  Neck: normal, supple, no masses. Respiratory: clear to auscultation bilaterally, no wheezing, no crackles. Normal respiratory effort. No accessory muscle use.  Cardiovascular: Regular rate and rhythm, no murmurs / rubs / gallops. No extremity edema. 2+ pedal pulses. Abdomen: no tenderness, no masses palpated. No hepatosplenomegaly. Bowel sounds positive.  Musculoskeletal: no clubbing / cyanosis. No joint deformity upper and lower extremities. Good ROM, no contractures. Normal muscle tone.  Skin: Swollen area of left lateral lower leg approximately 6 x 6 cm halfway up the shin with overlying abrasion which is scabbing over.  Has some surrounding erythema and bruising.  No open wound or active discharge present.  Area is tender to palpation. Neurologic: CN 2-12 grossly intact. Sensation intact,Strength 5/5 in all 4.  Psychiatric: Normal judgment and insight. Alert and oriented x 3. Normal mood.      Labs on Admission: I have personally reviewed following labs and imaging studies  CBC: Recent Labs  Lab 12/27/19 2005  WBC 11.3*  HGB 13.7  HCT 41.0  MCV 88.2  PLT 302   Basic Metabolic Panel: Recent Labs  Lab 12/27/19 2005  NA 136  K 4.1  CL 102  CO2 23  GLUCOSE 88  BUN 15  CREATININE 0.95  CALCIUM 9.5   GFR: Estimated Creatinine Clearance: 87.6 mL/min (by C-G formula based on SCr of 0.95 mg/dL). Liver Function Tests: No results for input(s): AST, ALT, ALKPHOS, BILITOT, PROT, ALBUMIN in the last 168 hours. No results for input(s): LIPASE, AMYLASE in the last 168 hours. No results for input(s): AMMONIA in the last 168 hours. Coagulation Profile: No results for input(s): INR, PROTIME in the last 168 hours. Cardiac Enzymes: No results for input(s): CKTOTAL, CKMB, CKMBINDEX, TROPONINI in the last 168 hours. BNP (last 3 results) No results for input(s): PROBNP in the last 8760 hours. HbA1C: No results for input(s): HGBA1C in the last 72 hours. CBG: No results for input(s): GLUCAP in the last 168 hours. Lipid Profile:  No results for input(s): CHOL, HDL, LDLCALC, TRIG, CHOLHDL, LDLDIRECT in the last 72 hours. Thyroid Function Tests: No results for input(s): TSH, T4TOTAL, FREET4, T3FREE, THYROIDAB in the last 72 hours. Anemia Panel: No results for input(s): VITAMINB12, FOLATE, FERRITIN, TIBC, IRON, RETICCTPCT in the last 72 hours. Urine analysis: No results found for: COLORURINE, APPEARANCEUR, LABSPEC, PHURINE, GLUCOSEU, HGBUR, BILIRUBINUR, KETONESUR, PROTEINUR, UROBILINOGEN, NITRITE, LEUKOCYTESUR  Radiological Exams on Admission: DG Ribs Unilateral W/Chest Left  Result Date: 12/27/2019 CLINICAL DATA:  Fall, left rib pain EXAM: LEFT RIBS AND CHEST - 3+ VIEW COMPARISON:  None. FINDINGS: No fracture or other bone lesions are seen involving the ribs. There is no evidence of pneumothorax or pleural effusion. Both lungs are clear. Heart size and mediastinal contours  are within normal limits. IMPRESSION: Negative. Electronically Signed   By: Helyn Numbers MD   On: 12/27/2019 15:45   DG Tibia/Fibula Left  Result Date: 12/27/2019 CLINICAL DATA:  Fall, left leg pain EXAM: LEFT TIBIA AND FIBULA - 2 VIEW COMPARISON:  None. FINDINGS: There is no evidence of fracture or other focal bone lesions. Soft tissues are unremarkable. IMPRESSION: Negative. Electronically Signed   By: Helyn Numbers MD   On: 12/27/2019 15:45   VAS [place] LOWER EXTREMITY VENOUS (DVT) ([facility] and [facility] 7a-7p)  Result Date: 12/27/2019  Lower Venous DVTStudy Indications: Edema.  Comparison Study: no prior Performing Technologist: Blanch Media RVS  Examination Guidelines: A complete evaluation includes B-mode imaging, spectral Doppler, color Doppler, and power Doppler as needed of all accessible portions of each vessel. Bilateral testing is considered an integral part of a complete examination. Limited examinations for reoccurring indications may be performed as noted. The reflux portion of the exam is performed with the patient in reverse Trendelenburg.  +-----+---------------+---------+-----------+----------+--------------+ RIGHTCompressibilityPhasicitySpontaneityPropertiesThrombus Aging +-----+---------------+---------+-----------+----------+--------------+ CFV  Full           Yes      Yes                                 +-----+---------------+---------+-----------+----------+--------------+   +---------+---------------+---------+-----------+----------+--------------+ LEFT     CompressibilityPhasicitySpontaneityPropertiesThrombus Aging +---------+---------------+---------+-----------+----------+--------------+ CFV      Full           Yes      Yes                                 +---------+---------------+---------+-----------+----------+--------------+ SFJ      Full                                                         +---------+---------------+---------+-----------+----------+--------------+ FV Prox  Full                                                        +---------+---------------+---------+-----------+----------+--------------+ FV Mid   Full                                                        +---------+---------------+---------+-----------+----------+--------------+  FV DistalFull                                                        +---------+---------------+---------+-----------+----------+--------------+ PFV      Full                                                        +---------+---------------+---------+-----------+----------+--------------+ POP      Full           Yes      Yes                                 +---------+---------------+---------+-----------+----------+--------------+ PTV      Full                                                        +---------+---------------+---------+-----------+----------+--------------+ PERO     Full                                                        +---------+---------------+---------+-----------+----------+--------------+     Summary: RIGHT: - No evidence of common femoral vein obstruction.  LEFT: - There is no evidence of deep vein thrombosis in the lower extremity.  - No cystic structure found in the popliteal fossa.  *See table(s) above for measurements and observations.    Preliminary     EKG: Not performed.  Assessment/Plan Principal Problem:   Cellulitis of left lower extremity Active Problems:   Type 2 diabetes mellitus (HCC)   Hypertension associated with diabetes (HCC)   Hyperlipidemia associated with type 2 diabetes mellitus (HCC)  Roger Fernandez is a 64 y.o. male with medical history significant for type 2 diabetes, hypertension, hyperlipidemia, cervical disc disease s/p ACDF who is admitted with cellulitis of left lower extremity after traumatic injury.  Cellulitis of left lower extremity  after traumatic injury: Occurred after injury from horse riding accident.  Has soft tissue swelling and slight surrounding erythema/bruising with overlying abrasion.  No open wound or active discharge.  Per EDP, bedside ultrasound without evidence of fluid collection. -Continue IV Ancef overnight and can likely transition to oral antibiotics tomorrow -Continue Tylenol/ibuprofen as needed  Type 2 diabetes: Continue Metformin.  Hypertension: Continue lisinopril-HCTZ.  Hyperlipidemia: Continue atorvastatin.  DVT prophylaxis: Lovenox Code Status: Full code, confirmed with patient Family Communication: Discussed with patient, he has discussed with family Disposition Plan: From home and likely discharge to home in 1 day Consults called: None Admission status:  Status is: Observation  The patient remains OBS appropriate and will d/c before 2 midnights.  Dispo: The patient is from: Home              Anticipated d/c is to: Home  Anticipated d/c date is: 1 day              Patient currently is not medically stable to d/c.  Darreld McleanVishal Jasamine Pottinger MD [facility]  If 7PM-7AM, please contact night-coverage [email or web address]  12/27/2019, 11:07 PM

## 2019-12-27 NOTE — ED Provider Notes (Signed)
[facility] Provider Note   CSN: [id number] Arrival date & time: 12/27/19  1435     History Chief Complaint  Patient presents with  . Leg Pain    Roger Fernandez is a 64 y.o. male.  HPI    64 year old man history of type 2 diabetes, hypertension, presents today complaining of left leg pain after injury and horse riding accident 1 week ago.  He states that the horse fell and landed partly on his leg.  He had an abrasion to the lateral aspect of the leg and some pain in the lateral part of his chest.  The abrasion and lower leg have continued to swell.  He is having increasing pain and difficulty walking secondary to pain.  Patient had tib-fib x-Katora Fini obtained prior to my evaluation.  He had chest x-Roel Douthat with left ribs obtained prior to my evaluation.  He denies striking his head, neck pain, or other pain or injury at this time.  He has been treating this by trying to keep his weight off of it.  He has not noted fever or chills. Patient did not have Covid but did have his vaccine in February from [employer] Past Medical History:  Diagnosis Date  . Hypertension    DR Martha ClanWILLIAM SHAW PCP  DR [facility] CARDI[facility]    There are no problems to display for this patient.   Past Surgical History:  Procedure Laterality Date  . ANTERIOR CERVICAL DECOMP/DISCECTOMY FUSION  06/06/2011   Procedure: ANTERIOR CERVICAL DECOMPRESSION/DISCECTOMY FUSION 3 LEVELS;  Surgeon: Hewitt Shortsobert W Nudelman;  Location: [facility]UR[facility] ORS;  Service: Neurosurgery;  Laterality: N/A;  Cervical Three-four,Cervical Four-Five,Cervical Five-Six anterior cervical decompression with fusion plating and bonegraft  . APPENDECTOMY    . TONSILLECTOMY         No family history on file.  Social History   Tobacco Use  . Smoking status: Current Some Day Smoker    Types: Cigars  Substance Use Topics  . Alcohol use: Yes    Comment: OCC.  . Drug use: No    Home Medications Prior to Admission medications     Medication Sig Start Date End Date Taking? Authorizing Provider  lisinopril-hydrochlorothiazide (PRINZIDE,ZESTORETIC) 20-12.5 MG per tablet Take 1 tablet by mouth daily.     [provider]  methylPREDNISolone (MEDROL DOSEPAK) 4 MG tablet Take 4 mg by mouth See admin instructions. follow package directions     [provider]  oxyCODONE-acetaminophen (PERCOCET) 5-325 MG per tablet Take 1 tablet by mouth every 6 (six) hours as needed. For pain     [provider]  tadalafil (CIALIS) 10 MG tablet Take 10 mg by mouth daily as needed. For ED    [provider]    Allergies    Patient has no known allergies.  Review of Systems   Review of Systems  All other systems reviewed and are negative.   Physical Exam Updated Vital Signs BP 134/71   Pulse 81   Temp 98.3 F (36.8 C) (Oral)   Resp 18   Ht 1.854 m (6\' 1" )   Wt 88 kg   SpO2 99%   BMI 25.60 kg/m   Physical Exam Vitals and nursing note reviewed.  Constitutional:      Appearance: Normal appearance.  HENT:     Head: Normocephalic.     Right Ear: External ear normal.     Left Ear: External ear normal.     Nose: Nose normal.  Eyes:  Pupils: Pupils are equal, round, and reactive to light.  Cardiovascular:     Rate and Rhythm: Normal rate and regular rhythm.     Pulses: Normal pulses.  Pulmonary:     Effort: Pulmonary effort is normal.     Breath sounds: Normal breath sounds.  Abdominal:     General: Abdomen is flat. Bowel sounds are normal.     Palpations: Abdomen is soft.  Musculoskeletal:     Cervical back: Normal range of motion.     Comments: Abrasion left side of lower leg Erythema ttp Diffuse edema of lower leg with pitting edema and diffuse ttp with greatest ttp over lateral leg, no pus noted   Skin:    General: Skin is warm and dry.     Capillary Refill: Capillary refill takes less than 2 seconds.  Neurological:     General: No focal deficit present.     Mental Status:  He is alert.     ED Results / Procedures / Treatments   Labs (all labs ordered are listed, but only abnormal results are displayed) Labs Reviewed  CBC  BASIC METABOLIC PANEL    EKG None  Radiology DG Ribs Unilateral W/Chest Left  Result Date: 12/27/2019 CLINICAL DATA:  Fall, left rib pain EXAM: LEFT RIBS AND CHEST - 3+ VIEW COMPARISON:  None. FINDINGS: No fracture or other bone lesions are seen involving the ribs. There is no evidence of pneumothorax or pleural effusion. Both lungs are clear. Heart size and mediastinal contours are within normal limits. IMPRESSION: Negative. Electronically Signed   By: Helyn Numbers MD   On: 12/27/2019 15:45   DG Tibia/Fibula Left  Result Date: 12/27/2019 CLINICAL DATA:  Fall, left leg pain EXAM: LEFT TIBIA AND FIBULA - 2 VIEW COMPARISON:  None. FINDINGS: There is no evidence of fracture or other focal bone lesions. Soft tissues are unremarkable. IMPRESSION: Negative. Electronically Signed   By: Helyn Numbers MD   On: 12/27/2019 15:45   VAS [place] LOWER EXTREMITY VENOUS (DVT) ([facility] and [facility] 7a-7p)  Result Date: 12/27/2019  Lower Venous DVTStudy Indications: Edema.  Comparison Study: no prior Performing Technologist: Blanch Media RVS  Examination Guidelines: A complete evaluation includes B-mode imaging, spectral Doppler, color Doppler, and power Doppler as needed of all accessible portions of each vessel. Bilateral testing is considered an integral part of a complete examination. Limited examinations for reoccurring indications may be performed as noted. The reflux portion of the exam is performed with the patient in reverse Trendelenburg.  +-----+---------------+---------+-----------+----------+--------------+ RIGHTCompressibilityPhasicitySpontaneityPropertiesThrombus Aging +-----+---------------+---------+-----------+----------+--------------+ CFV  Full           Yes      Yes                                  +-----+---------------+---------+-----------+----------+--------------+   +---------+---------------+---------+-----------+----------+--------------+ LEFT     CompressibilityPhasicitySpontaneityPropertiesThrombus Aging +---------+---------------+---------+-----------+----------+--------------+ CFV      Full           Yes      Yes                                 +---------+---------------+---------+-----------+----------+--------------+ SFJ      Full                                                        +---------+---------------+---------+-----------+----------+--------------+  FV Prox  Full                                                        +---------+---------------+---------+-----------+----------+--------------+ FV Mid   Full                                                        +---------+---------------+---------+-----------+----------+--------------+ FV DistalFull                                                        +---------+---------------+---------+-----------+----------+--------------+ PFV      Full                                                        +---------+---------------+---------+-----------+----------+--------------+ POP      Full           Yes      Yes                                 +---------+---------------+---------+-----------+----------+--------------+ PTV      Full                                                        +---------+---------------+---------+-----------+----------+--------------+ PERO     Full                                                        +---------+---------------+---------+-----------+----------+--------------+     Summary: RIGHT: - No evidence of common femoral vein obstruction.  LEFT: - There is no evidence of deep vein thrombosis in the lower extremity.  - No cystic structure found in the popliteal fossa.  *See table(s) above for measurements and observations.    Preliminary      Procedures Procedures (including critical care time) EMERGENCY DEPARTMENT US SOFT TISSUE INTERPRETATION "Study: Limited Soft Tissue Ultrasound"  INDICATIONS: Soft tissue infection Multiple views of the body part were obtained in real-time with a multi-frequency linear probe  PERFORMED BY: Myself IMAGES ARCHIVED?: Yes SIDE:Left BODY PART:Lower extremity INTERPRETATION:  Cellulitis present   Medications Ordered in ED Medications  ceFAZolin (ANCEF) IVPB 1 g/50 mL premix (has no administration in time range)    ED Course  I have reviewed the triage vital signs and the nursing notes.  Pertinent labs & imaging results that were available during my care of the patient were reviewed by me and considered in my medical decision making (see chart for details).  MDM Rules/Calculators/A&P                          64 year old man who was injured in a fall from a horse a week ago.  Since that time he has had increasing pain in his left lower leg with there is an abrasion.  Today he presents with swelling and edema of the lower extremity.  A formal Doppler was obtained and no evidence of DVT is noted.  Bedside ultrasound reveals diffuse swelling consistent with cellulitis.  The area is erythematous.  He is receiving IV antibiotics.  Plan admission for ongoing treatment and evaluation. Final Clinical Impression(s) / ED Diagnoses Final diagnoses:  Leg edema, left  Cellulitis of left lower leg    Rx / DC Orders ED Discharge Orders    None       Margarita Grizzle, MD 12/30/19 1448

## 2019-12-27 NOTE — ED Triage Notes (Signed)
Pt here for eval of L calf pain and swelling after a horse stepped in a hole and fell on him on 7/3. Also endorses L rib cage pain.

## 2019-12-28 ENCOUNTER — Observation Stay (HOSPITAL_COMMUNITY): Payer: Medicare Other

## 2019-12-28 DIAGNOSIS — E1159 Type 2 diabetes mellitus with other circulatory complications: Secondary | ICD-10-CM | POA: Diagnosis not present

## 2019-12-28 DIAGNOSIS — I10 Essential (primary) hypertension: Secondary | ICD-10-CM | POA: Diagnosis not present

## 2019-12-28 DIAGNOSIS — L03116 Cellulitis of left lower limb: Secondary | ICD-10-CM | POA: Diagnosis not present

## 2019-12-28 LAB — CBC
HCT: 39.7 % (ref 39.0–52.0)
Hemoglobin: 13.1 g/dL (ref 13.0–17.0)
MCH: 29.4 pg (ref 26.0–34.0)
MCHC: 33 g/dL (ref 30.0–36.0)
MCV: 89 fL (ref 80.0–100.0)
Platelets: 281 10*3/uL (ref 150–400)
RBC: 4.46 MIL/uL (ref 4.22–5.81)
RDW: 13.1 % (ref 11.5–15.5)
WBC: 10.4 10*3/uL (ref 4.0–10.5)
nRBC: 0 % (ref 0.0–0.2)

## 2019-12-28 LAB — HIV ANTIBODY (ROUTINE TESTING W REFLEX): HIV Screen 4th Generation wRfx: NONREACTIVE

## 2019-12-28 LAB — SARS CORONAVIRUS 2 BY RT PCR (HOSPITAL ORDER, PERFORMED IN ~~LOC~~ HOSPITAL LAB): SARS Coronavirus 2: NEGATIVE

## 2019-12-28 MED ORDER — CEPHALEXIN 500 MG PO CAPS: 500.0000 mg | ORAL_CAPSULE | Freq: Three times a day (TID) | ORAL | 0 refills | Status: AC

## 2019-12-28 MED ORDER — OXYCODONE HCL 5 MG PO TABS: 5.0000 mg | ORAL_TABLET | Freq: Three times a day (TID) | ORAL | 0 refills | Status: DC | PRN

## 2019-12-28 NOTE — Plan of Care (Signed)

## 2019-12-28 NOTE — Progress Notes (Signed)
Discharge instructions given, IV removed.  ?

## 2019-12-28 NOTE — Plan of Care (Signed)

## 2019-12-28 NOTE — TOC Transition Note (Signed)
Transition of Care [facility]) - CM/SW Discharge Note   Patient Details  Name: Roger Fernandez MRN: [id number] Date of Birth: December 14, 1955  [facility] Care [facility]) CM/SW Contact:  Deveron Furlong, RN 12/28/2019, 3:20 PM   Clinical Narrative:    Patient to d/c home with HHPT.  Patient has no preference of agency.  Referral accepted by [facility].  Patient states he does not need any equipment.  If he needs a walker, he can borrow one.   Patient's street address is [address] [place], [place],  [address]   Final next level of care: Home w Home Health Services Barriers to Discharge: No Barriers Identified   Patient Goals and CMS Choice Patient states their goals for this hospitalization and ongoing recovery are:: to get home CMS Medicare.gov Compare Post Acute Care list provided to:: Patient Choice offered to / list presented to : Patient   Discharge Plan and Services      HH Arranged: PT [facility] Agency: [facility] Date [facility] Agency Contacted: 12/28/19 Time HH Agency Contacted: 1520 Representative spoke with at [facility] Agency: Kandee Keen

## 2019-12-28 NOTE — Discharge Summary (Signed)
Physician Discharge Summary  Roger Fernandez ZOX:[id number] DOB: [birth year] DOA: 12/27/2019  PCP: Roger Clan, MD  Admit date: 12/27/2019 Discharge date: 12/28/2019  Admitted From: Home  Disposition:  Home with [facility]   Recommendations for Outpatient Follow-up:  1. Follow up with PCP Dr. Clelia Fernandez in 1-2 weeks 2. Dr. Clelia Fernandez: Please re-evaluate hematoma and consider extending antibiotics if needed       Home Health: PT due to ongoing pain with ambulation  Equipment/Devices: None  Discharge Condition: Good  CODE STATUS: FULL Diet recommendation: Low carb  Brief/Interim Summary: Roger Fernandez is a 64 y.o. M with hx DM, HTN who presented with left leg pain and swelling after fall.  One week ago, patient was riding a horse, fell, horse landed on his leg.  Afterwards he had a focal area of swelling on the left lateral lower leg and abrasion.  This got better at first, then in the last few days he had more and more pain, so he came to the ER.  In the ER, noted to have WBC 11K.  Radiographs ruled out fracture, [place] ruled out DVT.  He was thought to have infection, so admitted for IV antibiotics.          PRINCIPAL HOSPITAL DIAGNOSIS: Cellulitis due to hematoma     Discharge Diagnoses:   Cellulitis This is mild, subtle.  Treated with cefazolin here, will discharge with 5 more days cephalexin for mild infection.  Follow up with PCP in 1 week for re-eval.    Hematoma US showed partially organizing fluid collection, likely hematoma.  Discussed with Orthopedics, likely at this point, too organized to aspirate.  Warm compresses, analgesics, and clinical follow up only treatments recommended.  Follow up with PCP.  Discussed probably long time frame for resolution with patient.    Diabetes, type 2, non-insulin dependent, no complications  Hypertension          Discharge Instructions  Discharge Instructions    Discharge instructions   Complete by: As directed    From Dr.  Maryfrances Fernandez: You were admitted for a swelling in your leg.   This appears to be a resolving hematoma. A "hematoma" means a blood collection (probably a vessel broke in the fall, and blood leaked out into the cavity around it, creating a blood pocket in the shin there).  These are common.  No particular treatment is needed or would help.  Surgery or else would probably hurt more than it would help.  Use warm compresses as needed.  Use tylenol (1000 mg or two tabs, up to three times per day) If pain is severe, use oxycodone 5 mg  Oxycodone is an opiate, like morphine, so use sparingly, do not use with alcohol or while driving, and do not use in excess of recommended.  Go see your primary in 1-2 weeks for follow up of the leg  In case there is some infection that has started, you should take cephalexin 500 mg three times daily for 5 more days then stop  Resume your home medicines but STOP your aspirin for now (until this is mostly healed, then you should restart it in a month or so)   Increase activity slowly   Complete by: As directed      Allergies as of 12/28/2019   No Known Allergies     Medication List    STOP taking these medications   methylPREDNISolone 4 MG tablet Commonly known as: MEDROL DOSEPAK   oxyCODONE-acetaminophen 5-325 MG tablet Commonly known  as: PERCOCET/ROXICET   tadalafil 10 MG tablet Commonly known as: CIALIS     TAKE these medications   aspirin EC 81 MG tablet Take 81 mg by mouth daily. Swallow whole.   atorvastatin 20 MG tablet Commonly known as: LIPITOR Take 20 mg by mouth daily.   C[place] 450 MG Caps Take 450 mg by mouth at bedtime.   cephALEXin 500 MG capsule Commonly known as: KEFLEX Take 1 capsule (500 mg total) by mouth 3 (three) times daily for 5 days.   diphenhydramine-acetaminophen 25-500 MG Tabs tablet Commonly known as: TYLENOL PM Take 1 tablet by mouth at bedtime as needed (sleep.).   lisinopril-hydrochlorothiazide 20-12.5  MG tablet Commonly known as: ZESTORETIC Take 1 tablet by mouth daily.   metFORMIN 500 MG tablet Commonly known as: GLUCOPHAGE Take 500 mg by mouth 2 (two) times daily.   oxyCODONE 5 MG immediate release tablet Commonly known as: Roxicodone Take 1 tablet (5 mg total) by mouth every 8 (eight) hours as needed.   PRESCRIPTION MEDICATION Apply 1 application topically daily. Testosterone compounded cream.   PREVAGEN PO Take 1 capsule by mouth daily.   Valerian 500 MG Caps Take 500 mg by mouth daily as needed (calming.).       No Known Allergies  Consultations:     Procedures/Studies: DG Ribs Unilateral W/Chest Left  Result Date: 12/27/2019 CLINICAL DATA:  Fall, left rib pain EXAM: LEFT RIBS AND CHEST - 3+ VIEW COMPARISON:  None. FINDINGS: No fracture or other bone lesions are seen involving the ribs. There is no evidence of pneumothorax or pleural effusion. Both lungs are clear. Heart size and mediastinal contours are within normal limits. IMPRESSION: Negative. Electronically Signed   By: Helyn NumbersAshesh  Parikh MD   On: 12/27/2019 15:45   DG Tibia/Fibula Left  Result Date: 12/27/2019 CLINICAL DATA:  Fall, left leg pain EXAM: LEFT TIBIA AND FIBULA - 2 VIEW COMPARISON:  None. FINDINGS: There is no evidence of fracture or other focal bone lesions. Soft tissues are unremarkable. IMPRESSION: Negative. Electronically Signed   By: Helyn NumbersAshesh  Parikh MD   On: 12/27/2019 15:45   US LT LOWER EXTREM LTD SOFT TISSUE NON VASCULAR  Result Date: 12/28/2019 CLINICAL DATA:  Pain, swelling and inflammation involving the left calf area. EXAM: ULTRASOUND left LOWER EXTREMITY LIMITED TECHNIQUE: Ultrasound examination of the lower extremity soft tissues was performed in the area of clinical concern. COMPARISON:  None. FINDINGS: The patient's palpable abnormality corresponds to a large complex fluid collection in the subcutaneous tissues superficial to the calf musculature. This measures a maximum of 8.4 x 1.5 x 7.1  cm. This demonstrates heterogeneous decreased echogenicity. No obvious internal blood flow. Findings most likely due to a partially liquified hematoma. Recommend clinical surveillance and reimaging with MRI if this does not resolve as it should clinically. IMPRESSION: 8.4 x 1.5 x 7.1 cm complex fluid collection, likely a partially liquified hematoma. Recommend clinical surveillance and reimaging with MRI if this does not resolve as it should clinically. Electronically Signed   By: Rudie MeyerP.  Gallerani M.D.   On: 12/28/2019 12:30   VAS US LOWER EXTREMITY VENOUS (DVT) (MC and WL 7a-7p)  Result Date: 12/27/2019  Lower Venous DVTStudy Indications: Edema.  Comparison Study: no prior Performing Technologist: Blanch MediaMegan Riddle RVS  Examination Guidelines: A complete evaluation includes B-mode imaging, spectral Doppler, color Doppler, and power Doppler as needed of all accessible portions of each vessel. Bilateral testing is considered an integral part of a complete examination. Limited examinations for reoccurring  indications may be performed as noted. The reflux portion of the exam is performed with the patient in reverse Trendelenburg.  +-----+---------------+---------+-----------+----------+--------------+ RIGHTCompressibilityPhasicitySpontaneityPropertiesThrombus Aging +-----+---------------+---------+-----------+----------+--------------+ CFV  Full           Yes      Yes                                 +-----+---------------+---------+-----------+----------+--------------+   +---------+---------------+---------+-----------+----------+--------------+ LEFT     CompressibilityPhasicitySpontaneityPropertiesThrombus Aging +---------+---------------+---------+-----------+----------+--------------+ CFV      Full           Yes      Yes                                 +---------+---------------+---------+-----------+----------+--------------+ SFJ      Full                                                         +---------+---------------+---------+-----------+----------+--------------+ FV Prox  Full                                                        +---------+---------------+---------+-----------+----------+--------------+ FV Mid   Full                                                        +---------+---------------+---------+-----------+----------+--------------+ FV DistalFull                                                        +---------+---------------+---------+-----------+----------+--------------+ PFV      Full                                                        +---------+---------------+---------+-----------+----------+--------------+ POP      Full           Yes      Yes                                 +---------+---------------+---------+-----------+----------+--------------+ PTV      Full                                                        +---------+---------------+---------+-----------+----------+--------------+ PERO     Full                                                        +---------+---------------+---------+-----------+----------+--------------+  Summary: RIGHT: - No evidence of common femoral vein obstruction.  LEFT: - There is no evidence of deep vein thrombosis in the lower extremity.  - No cystic structure found in the popliteal fossa.  *See table(s) above for measurements and observations.    Preliminary        Subjective: No fever, chills, malaise.  Patient is feeling well.  He has pain with walking.  Discharge Exam: Vitals:   12/28/19 0339 12/28/19 0921  BP: 117/67 126/63  Pulse: 69 74  Resp: 14 16  Temp: 98.1 F (36.7 C) 98.6 F (37 C)  SpO2: 99% 98%   Vitals:   12/27/19 2243 12/27/19 2336 12/28/19 0339 12/28/19 0921  BP: (!) 147/77 136/72 117/67 126/63  Pulse: 79 72 69 74  Resp:  16 14 16   Temp:  98.4 F (36.9 C) 98.1 F (36.7 C) 98.6 F (37 C)  TempSrc:  Oral Oral Oral  SpO2: 98% 100% 99%  98%  Weight:      Height:        General: Pt is alert, awake, not in acute distress Cardiovascular: RRR, nl S1-S2, no murmurs appreciated.   No LE edema.   Respiratory: Normal respiratory rate and rhythm.  CTAB without rales or wheezes. Abdominal: Abdomen soft and non-tender.  No distension or HSM.   MSK: There is a focal swelling, about the size of my palm on the left lateral shin with minimal surrounding redness. Neuro/Psych: Strength symmetric in upper and lower extremities.  Judgment and insight appear normal.   The results of significant diagnostics from this hospitalization (including imaging, microbiology, ancillary and laboratory) are listed below for reference.     Microbiology: Recent Results (from the past 240 hour(s))  SARS Coronavirus 2 by RT PCR (hospital order, performed in [facility] hospital lab) Nasopharyngeal Nasopharyngeal Swab     Status: None   Collection Time: 12/27/19 10:02 PM   Specimen: Nasopharyngeal Swab  Result Value Ref Range Status   SARS Coronavirus 2 NEGATIVE NEGATIVE Final    Comment: (NOTE) SARS-CoV-2 target nucleic acids are NOT DETECTED.  The SARS-CoV-2 RNA is generally detectable in upper and lower respiratory specimens during the acute phase of infection. The lowest concentration of SARS-CoV-2 viral copies this assay can detect is 250 copies / mL. A negative result does not preclude SARS-CoV-2 infection and should not be used as the sole basis for treatment or other patient management decisions.  A negative result may occur with improper specimen collection / handling, submission of specimen other than nasopharyngeal swab, presence of viral mutation(s) within the areas targeted by this assay, and inadequate number of viral copies (<250 copies / mL). A negative result must be combined with clinical observations, patient history, and epidemiological information.  Fact Sheet for Patients:   02/27/20  Fact  Sheet for Healthcare Providers: BoilerBrush.com.cy  This test is not yet approved or  cleared by the https://pope.com/ FDA and has been authorized for detection and/or diagnosis of SARS-CoV-2 by FDA under an Emergency Use Authorization (EUA).  This EUA will remain in effect (meaning this test can be used) for the duration of the COVID-19 declaration under Section 564(b)(1) of the Act, 21 U.S.C. section 360bbb-3(b)(1), unless the authorization is terminated or revoked sooner.  Performed at [facility], [address], [place], [place]      Labs: BNP (last 3 results) No results for input(s): BNP in the last 8760 hours. Basic Metabolic Panel: Recent Labs  Lab 12/27/19  2005  NA 136  K 4.1  CL 102  CO2 23  GLUCOSE 88  BUN 15  CREATININE 0.95  CALCIUM 9.5   Liver Function Tests: No results for input(s): AST, ALT, ALKPHOS, BILITOT, PROT, ALBUMIN in the last 168 hours. No results for input(s): LIPASE, AMYLASE in the last 168 hours. No results for input(s): AMMONIA in the last 168 hours. CBC: Recent Labs  Lab 12/27/19 2005 12/28/19 0619  WBC 11.3* 10.4  HGB 13.7 13.1  HCT 41.0 39.7  MCV 88.2 89.0  PLT 302 281   Cardiac Enzymes: No results for input(s): CKTOTAL, CKMB, CKMBINDEX, TROPONINI in the last 168 hours. BNP: Invalid input(s): POCBNP CBG: No results for input(s): GLUCAP in the last 168 hours. D-Dimer No results for input(s): DDIMER in the last 72 hours. Hgb A1c No results for input(s): HGBA1C in the last 72 hours. Lipid Profile No results for input(s): CHOL, HDL, LDLCALC, TRIG, CHOLHDL, LDLDIRECT in the last 72 hours. Thyroid function studies No results for input(s): TSH, T4TOTAL, T3FREE, THYROIDAB in the last 72 hours.  Invalid input(s): FREET3 Anemia work up No results for input(s): VITAMINB12, FOLATE, FERRITIN, TIBC, IRON, RETICCTPCT in the last 72 hours. Urinalysis No results found for: COLORURINE, APPEARANCEUR,  LABSPEC, PHURINE, GLUCOSEU, HGBUR, BILIRUBINUR, KETONESUR, PROTEINUR, UROBILINOGEN, NITRITE, LEUKOCYTESUR Sepsis Labs Invalid input(s): PROCALCITONIN,  WBC,  LACTICIDVEN Microbiology Recent Results (from the past 240 hour(s))  SARS Coronavirus 2 by RT PCR (hospital order, performed in [facility] hospital lab) Nasopharyngeal Nasopharyngeal Swab     Status: None   Collection Time: 12/27/19 10:02 PM   Specimen: Nasopharyngeal Swab  Result Value Ref Range Status   SARS Coronavirus 2 NEGATIVE NEGATIVE Final    Comment: (NOTE) SARS-CoV-2 target nucleic acids are NOT DETECTED.  The SARS-CoV-2 RNA is generally detectable in upper and lower respiratory specimens during the acute phase of infection. The lowest concentration of SARS-CoV-2 viral copies this assay can detect is 250 copies / mL. A negative result does not preclude SARS-CoV-2 infection and should not be used as the sole basis for treatment or other patient management decisions.  A negative result may occur with improper specimen collection / handling, submission of specimen other than nasopharyngeal swab, presence of viral mutation(s) within the areas targeted by this assay, and inadequate number of viral copies (<250 copies / mL). A negative result must be combined with clinical observations, patient history, and epidemiological information.  Fact Sheet for Patients:   BoilerBrush.com.cy  Fact Sheet for Healthcare Providers: https://pope.com/  This test is not yet approved or  cleared by the [place] FDA and has been authorized for detection and/or diagnosis of SARS-CoV-2 by FDA under an Emergency Use Authorization (EUA).  This EUA will remain in effect (meaning this test can be used) for the duration of the COVID-19 declaration under Section 564(b)(1) of the Act, 21 U.S.C. section 360bbb-3(b)(1), unless the authorization is terminated or revoked sooner.  Performed at  [facility], [address] [place], [place] [address]      Time coordinating discharge: The [place] controlled substances registry was reviewed for this patient prior to filling the <5 days supply controlled substances script.      SIGNED:   Alberteen Sam, MD  [facility] 12/28/2019, 1:17 PM

## 2021-02-23 ENCOUNTER — Ambulatory Visit (INDEPENDENT_AMBULATORY_CARE_PROVIDER_SITE_OTHER): Payer: Medicare Other | Admitting: Cardiology

## 2021-02-23 ENCOUNTER — Other Ambulatory Visit: Payer: Self-pay

## 2021-02-23 ENCOUNTER — Encounter: Payer: Self-pay | Admitting: Cardiology

## 2021-02-23 ENCOUNTER — Inpatient Hospital Stay (HOSPITAL_COMMUNITY): Payer: Medicare Other

## 2021-02-23 ENCOUNTER — Inpatient Hospital Stay (HOSPITAL_COMMUNITY)
Admission: AD | Admit: 2021-02-23 | Discharge: 2021-02-24 | DRG: 287 | Disposition: A | Discharge status: Home / Self Care | Payer: Medicare Other | Source: Ambulatory Visit | Attending: Cardiology | Admitting: Cardiology

## 2021-02-23 VITALS — BP 139/76 | HR 73 | Ht 73.0 in | Wt 202.4 lb

## 2021-02-23 DIAGNOSIS — E7800 Pure hypercholesterolemia, unspecified: Secondary | ICD-10-CM | POA: Diagnosis not present

## 2021-02-23 DIAGNOSIS — I152 Hypertension secondary to endocrine disorders: Secondary | ICD-10-CM | POA: Diagnosis present

## 2021-02-23 DIAGNOSIS — Z20822 Contact with and (suspected) exposure to covid-19: Secondary | ICD-10-CM | POA: Diagnosis present

## 2021-02-23 DIAGNOSIS — E1159 Type 2 diabetes mellitus with other circulatory complications: Secondary | ICD-10-CM

## 2021-02-23 DIAGNOSIS — E119 Type 2 diabetes mellitus without complications: Secondary | ICD-10-CM | POA: Diagnosis not present

## 2021-02-23 DIAGNOSIS — R110 Nausea: Secondary | ICD-10-CM | POA: Diagnosis present

## 2021-02-23 DIAGNOSIS — I2510 Atherosclerotic heart disease of native coronary artery without angina pectoris: Secondary | ICD-10-CM | POA: Diagnosis not present

## 2021-02-23 DIAGNOSIS — E1169 Type 2 diabetes mellitus with other specified complication: Secondary | ICD-10-CM | POA: Diagnosis not present

## 2021-02-23 DIAGNOSIS — F17290 Nicotine dependence, other tobacco product, uncomplicated: Secondary | ICD-10-CM | POA: Diagnosis present

## 2021-02-23 DIAGNOSIS — Z8249 Family history of ischemic heart disease and other diseases of the circulatory system: Secondary | ICD-10-CM

## 2021-02-23 DIAGNOSIS — I10 Essential (primary) hypertension: Secondary | ICD-10-CM | POA: Diagnosis not present

## 2021-02-23 DIAGNOSIS — Z7982 Long term (current) use of aspirin: Secondary | ICD-10-CM | POA: Diagnosis not present

## 2021-02-23 DIAGNOSIS — Z79899 Other long term (current) drug therapy: Secondary | ICD-10-CM

## 2021-02-23 DIAGNOSIS — R079 Chest pain, unspecified: Secondary | ICD-10-CM | POA: Diagnosis not present

## 2021-02-23 DIAGNOSIS — Z981 Arthrodesis status: Secondary | ICD-10-CM | POA: Diagnosis not present

## 2021-02-23 DIAGNOSIS — Z6372 Alcoholism and drug addiction in family: Secondary | ICD-10-CM

## 2021-02-23 DIAGNOSIS — I200 Unstable angina: Secondary | ICD-10-CM | POA: Diagnosis present

## 2021-02-23 DIAGNOSIS — E785 Hyperlipidemia, unspecified: Secondary | ICD-10-CM | POA: Diagnosis present

## 2021-02-23 DIAGNOSIS — I959 Hypotension, unspecified: Secondary | ICD-10-CM | POA: Diagnosis present

## 2021-02-23 DIAGNOSIS — E782 Mixed hyperlipidemia: Secondary | ICD-10-CM | POA: Diagnosis present

## 2021-02-23 DIAGNOSIS — R61 Generalized hyperhidrosis: Secondary | ICD-10-CM | POA: Diagnosis present

## 2021-02-23 DIAGNOSIS — Z8052 Family history of malignant neoplasm of bladder: Secondary | ICD-10-CM | POA: Diagnosis not present

## 2021-02-23 LAB — CBC WITH DIFFERENTIAL/PLATELET
Abs Immature Granulocytes: 0.02 10*3/uL (ref 0.00–0.07)
Basophils Absolute: 0.1 10*3/uL (ref 0.0–0.1)
Basophils Relative: 1 %
Eosinophils Absolute: 0.3 10*3/uL (ref 0.0–0.5)
Eosinophils Relative: 3 %
HCT: 42.2 % (ref 39.0–52.0)
Hemoglobin: 14.5 g/dL (ref 13.0–17.0)
Immature Granulocytes: 0 %
Lymphocytes Relative: 34 %
Lymphs Abs: 3.5 10*3/uL (ref 0.7–4.0)
MCH: 29.8 pg (ref 26.0–34.0)
MCHC: 34.4 g/dL (ref 30.0–36.0)
MCV: 86.8 fL (ref 80.0–100.0)
Monocytes Absolute: 0.9 10*3/uL (ref 0.1–1.0)
Monocytes Relative: 9 %
Neutro Abs: 5.5 10*3/uL (ref 1.7–7.7)
Neutrophils Relative %: 53 %
Platelets: 247 10*3/uL (ref 150–400)
RBC: 4.86 MIL/uL (ref 4.22–5.81)
RDW: 13.2 % (ref 11.5–15.5)
WBC: 10.3 10*3/uL (ref 4.0–10.5)
nRBC: 0 % (ref 0.0–0.2)

## 2021-02-23 LAB — HEMOGLOBIN A1C
Hgb A1c MFr Bld: 6.8 % — ABNORMAL HIGH (ref 4.8–5.6)
Mean Plasma Glucose: 148.46 mg/dL

## 2021-02-23 LAB — COMPREHENSIVE METABOLIC PANEL
ALT: 24 U/L (ref 0–44)
AST: 20 U/L (ref 15–41)
Albumin: 3.7 g/dL (ref 3.5–5.0)
Alkaline Phosphatase: 37 U/L — ABNORMAL LOW (ref 38–126)
Anion gap: 11 (ref 5–15)
BUN: 11 mg/dL (ref 8–23)
CO2: 23 mmol/L (ref 22–32)
Calcium: 9.6 mg/dL (ref 8.9–10.3)
Chloride: 101 mmol/L (ref 98–111)
Creatinine, Ser: 0.92 mg/dL (ref 0.61–1.24)
GFR, Estimated: >60 mL/min (ref >60)
Glucose, Bld: 85 mg/dL (ref 70–99)
Potassium: 4 mmol/L (ref 3.5–5.1)
Sodium: 135 mmol/L (ref 135–145)
Total Bilirubin: 0.7 mg/dL (ref 0.3–1.2)
Total Protein: 6 g/dL — ABNORMAL LOW (ref 6.5–8.1)

## 2021-02-23 LAB — TSH: TSH: 2.853 u[IU]/mL (ref 0.350–4.500)

## 2021-02-23 LAB — GLUCOSE, CAPILLARY: Glucose-Capillary: 188 mg/dL — ABNORMAL HIGH (ref 70–99)

## 2021-02-23 LAB — MRSA NEXT GEN BY PCR, NASAL: MRSA by PCR Next Gen: NOT DETECTED

## 2021-02-23 LAB — HEPARIN LEVEL (UNFRACTIONATED): Heparin Unfractionated: <0.1 IU/mL — ABNORMAL LOW (ref 0.30–0.70)

## 2021-02-23 LAB — TROPONIN I (HIGH SENSITIVITY)
Troponin I (High Sensitivity): 5 ng/L (ref <18)
Troponin I (High Sensitivity): 5 ng/L (ref <18)

## 2021-02-23 LAB — HIV ANTIBODY (ROUTINE TESTING W REFLEX): HIV Screen 4th Generation wRfx: NONREACTIVE

## 2021-02-23 MED ORDER — SODIUM CHLORIDE 0.9% FLUSH
3.0000 mL | Freq: Two times a day (BID) | INTRAVENOUS | Status: DC
Start: 2021-02-23 — End: 2021-02-23

## 2021-02-23 MED ORDER — METOPROLOL TARTRATE 12.5 MG HALF TABLET
12.5000 mg | ORAL_TABLET | Freq: Two times a day (BID) | ORAL | Status: DC
Start: 2021-02-23 — End: 2021-02-25
  Administered 2021-02-23 – 2021-02-24 (×2): 12.5 mg via ORAL
  Filled 2021-02-23 (×2): qty 1

## 2021-02-23 MED ORDER — SODIUM CHLORIDE 0.9 % WEIGHT BASED INFUSION
3.0000 mL/kg/h | INTRAVENOUS | Status: DC
  Administered 2021-02-24: 3 mL/kg/h via INTRAVENOUS

## 2021-02-23 MED ORDER — ATORVASTATIN CALCIUM 40 MG PO TABS
40.0000 mg | ORAL_TABLET | Freq: Every day | ORAL | Status: DC
  Administered 2021-02-24: 40 mg via ORAL
  Filled 2021-02-23: qty 1

## 2021-02-23 MED ORDER — INSULIN ASPART 100 UNIT/ML IJ SOLN: 0.0000 [IU] | Freq: Three times a day (TID) | INTRAMUSCULAR | Status: DC

## 2021-02-23 MED ORDER — SODIUM CHLORIDE 0.9 % IV SOLN: 250.0000 mL | INTRAVENOUS | Status: DC | PRN

## 2021-02-23 MED ORDER — NITROGLYCERIN 0.4 MG SL SUBL: 0.4000 mg | SUBLINGUAL_TABLET | SUBLINGUAL | Status: DC | PRN

## 2021-02-23 MED ORDER — ASPIRIN EC 81 MG PO TBEC: 81.0000 mg | DELAYED_RELEASE_TABLET | Freq: Every day | ORAL | Status: DC

## 2021-02-23 MED ORDER — ATORVASTATIN CALCIUM 10 MG PO TABS
20.0000 mg | ORAL_TABLET | Freq: Once | ORAL | Status: AC
  Administered 2021-02-23: 20 mg via ORAL
  Filled 2021-02-23: qty 2

## 2021-02-23 MED ORDER — ACETAMINOPHEN 325 MG PO TABS
650.0000 mg | ORAL_TABLET | ORAL | Status: DC | PRN
  Administered 2021-02-24: 650 mg via ORAL
  Filled 2021-02-23: qty 2

## 2021-02-23 MED ORDER — NITROGLYCERIN IN D5W 200-5 MCG/ML-% IV SOLN
5.0000 ug/min | INTRAVENOUS | Status: DC
  Administered 2021-02-23: 5 ug/min via INTRAVENOUS
  Filled 2021-02-23: qty 250

## 2021-02-23 MED ORDER — SODIUM CHLORIDE 0.9% FLUSH: 3.0000 mL | INTRAVENOUS | Status: DC | PRN

## 2021-02-23 MED ORDER — ASPIRIN 81 MG PO CHEW
81.0000 mg | CHEWABLE_TABLET | ORAL | Status: AC
  Administered 2021-02-24: 81 mg via ORAL
  Filled 2021-02-23: qty 1

## 2021-02-23 MED ORDER — HEPARIN (PORCINE) 25000 UT/250ML-% IV SOLN
1350.0000 [IU]/h | INTRAVENOUS | Status: DC
  Administered 2021-02-23: 1100 [IU]/h via INTRAVENOUS

## 2021-02-23 MED ORDER — ONDANSETRON HCL 4 MG/2ML IJ SOLN: 4.0000 mg | Freq: Four times a day (QID) | INTRAMUSCULAR | Status: DC | PRN

## 2021-02-23 MED ORDER — HEPARIN BOLUS VIA INFUSION
4000.0000 [IU] | Freq: Once | INTRAVENOUS | Status: AC
  Administered 2021-02-23: 4000 [IU] via INTRAVENOUS
  Filled 2021-02-23: qty 4000

## 2021-02-23 MED ORDER — SODIUM CHLORIDE 0.9 % WEIGHT BASED INFUSION
1.0000 mL/kg/h | INTRAVENOUS | Status: DC
  Administered 2021-02-24: 1 mL/kg/h via INTRAVENOUS

## 2021-02-23 MED ORDER — SODIUM CHLORIDE 0.9% FLUSH
3.0000 mL | Freq: Two times a day (BID) | INTRAVENOUS | Status: DC
  Administered 2021-02-23: 3 mL via INTRAVENOUS

## 2021-02-23 NOTE — Plan of Care (Signed)
  Problem: Clinical Measurements: Goal: Ability to maintain clinical measurements within normal limits will improve Outcome: Not Progressing Goal: Cardiovascular complication will be avoided Outcome: Not Progressing   

## 2021-02-23 NOTE — Patient Instructions (Addendum)
Medication Instructions:  Your physician recommends that you continue on your current medications as directed. Please refer to the Current Medication list given to you today.  *If you need a refill on your cardiac medications before your next appointment, please call your pharmacy*   Follow-Up: At [facility], you and your health needs are our priority.  As part of our continuing mission to provide you with exceptional heart care, we have created designated Provider Care Teams.  These Care Teams include your primary Cardiologist (physician) and Advanced Practice Providers (APPs -  Physician Assistants and Nurse Practitioners) who all work together to provide you with the care you need, when you need it.    You are being admitted to [facility] - 2C

## 2021-02-23 NOTE — Progress Notes (Addendum)
ANTICOAGULATION CONSULT NOTE - Initial Consult  Pharmacy Consult for IV Heparin Indication: chest pain/ACS  No Known Allergies  Patient Measurements: Height: 73 inches Total Body Weight: 91.8 kg Heparin Dosing Weight:  91.8 kg  Vital Signs: BP: 139/76 (09/06 1600) Pulse Rate: 73 (09/06 1600)  Labs: Recent Labs    02/23/21 1901 02/23/21 2024  HGB 14.5  --   HCT 42.2  --   PLT 247  --   HEPARINUNFRC <0.10*  --   CREATININE 0.92  --   TROPONINIHS 5 5    CrCl cannot be calculated (Patient's most recent lab result is older than the maximum 21 days allowed.).   Medical History: Past Medical History:  Diagnosis Date   Hyperlipidemia associated with type 2 diabetes mellitus (HCC)    Hypertension    DR Martha Clan PCP  DR [facility] CARDIAC   Type 2 diabetes mellitus [facility])     Assessment: 65 yr old man admitted for evaluation of CP, SOB, unstable angina. Pharmacy is consulted for IV heparin dosing. Pt was on no anticoagulants PTA.  H/H 14.5/42.2, plt 247 (CBC WNL), Scr 0.92  Planning for cardiac cath tomorrow, 02/23/21  Goal of Therapy:  Heparin level 0.3-0.7 units/ml Monitor platelets by anticoagulation protocol: Yes   Plan:  Heparin 4000 units IV bolus X 1, followed by heparin infusion at 1100 units/hr F/U CBC, Scr this evening Check heparin level in 6 hrs Monitor daily heparin level, CBC Monitor for bleeding F/U post cath  Vicki Mallet, PharmD, BCPS, [facility] Clinical Pharmacist  02/23/2021,6:29 PM

## 2021-02-23 NOTE — H&P (Addendum)
Please see office note below from Dr. Mayford Knife which serves as this patient's H/P. Orders written per discussion and plan. Upon arrival, patient still has vague sensation of very mild residual chest discomfort as he had reported to Dr. Mayford Knife in clinic but "nowhere near what happened yesterday" - feels much better than yesterday. VSS except mildly hypertensive. He appears comfortable. Lungs clear. Heart RRR. He confirms he took his dose of ASA this AM. Per Epic, patient is on the cath board for Dr. Katrinka Blazing tomorrow. Per d/w Dr. Mayford Knife, will also obtain a baseline CXR and start low dose IV NTG. Baseline EKG ordered so that we have access to a comparison tracing from today.  Shows NSR with LVH with possible repolarization changes - baseline nonspecific ST upsloped pattern I, II, V5, not meeting STEMI criteria. Nurse has not yet been able to obtain due to working on securing IV access. Labs also pending. Will sign out pending results to on-call fellow to please review (CXR, labs).  --------------------------------------------------------------        Cardiology H&P   Date:  02/23/2021    ID:  Roger Fernandez, DOB April 07, 1956, MRN [id number]   PCP:  Cleatis Polka., MD           Cardiologist:  Armanda Magic, MD        Chief Complaint  Patient presents with   New Patient (Initial Visit)      Chest pain, SOB, HTN, HLD        History of Present Illness:  Roger Fernandez is a 65 y.o. male who is being seen today for the evaluation of chest pain and SOB at the request of Cleatis Polka., MD.   This is a 65yo male with a hx of HLD, DM2 and HTN who is referred for SOB and chest pain. He was seen earlier today by his PCP for complaints of chest tightness that started around 5pm yesterday after working in the yard all morning.  Symptoms started after he had showered and was sitting resting.  The pressure lasted about an hour and was accompanied by diaphoersis, SOB and fatigue.  There was no radiation of  the pain.  He drank some cold water and used a cold compress and his sx resolved after an hour but felt weak for the rest of the evening.  Today he has felt fatigued.  He apparently has been having intermittent chest tightness over the past month but not severe like yesterday and occurs at rest.  His BP was 83/37mmHg while having CP yesterday. He is now worked in as an urgent visit for evaluation.  He tells me that he still feels SOB and continues to have CP about 3/10.  He says that it is a sharp pain poking at his heart as well as tightness but not as bad.  He occasionally smokes a cigar.         Past Medical History:  Diagnosis Date   Hyperlipidemia associated with type 2 diabetes mellitus (HCC)     Hypertension      DR Martha Clan PCP  DR [facility] CARDIAC   Type 2 diabetes mellitus [facility])             Past Surgical History:  Procedure Laterality Date   ANTERIOR CERVICAL DECOMP/DISCECTOMY FUSION   06/06/2011    Procedure: ANTERIOR CERVICAL DECOMPRESSION/DISCECTOMY FUSION 3 LEVELS;  Surgeon: Hewitt Shorts;  Location: [facility];  Service: Neurosurgery;  Laterality: N/A;  Cervical  Three-four,Cervical Four-Five,Cervical Five-Six anterior cervical decompression with fusion plating and bonegraft   APPENDECTOMY       TONSILLECTOMY          Current Medications: Active Medications      Current Meds  Medication Sig   Apoaequorin (PREVAGEN PO) Take 1 capsule by mouth daily.   aspirin EC 81 MG tablet Take 81 mg by mouth daily. Swallow whole.   atorvastatin (LIPITOR) 20 MG tablet Take 20 mg by mouth daily.   Cascara Sagrada 450 MG CAPS Take 450 mg by mouth at bedtime.   diphenhydramine-acetaminophen (TYLENOL PM) 25-500 MG TABS tablet Take 1 tablet by mouth at bedtime as needed (sleep.).   lisinopril-hydrochlorothiazide (PRINZIDE,ZESTORETIC) 20-12.5 MG per tablet Take 1 tablet by mouth daily.    metFORMIN (GLUCOPHAGE) 500 MG tablet Take 500 mg by mouth 2 (two) times daily.   PRESCRIPTION  MEDICATION Apply 1 application topically daily. Testosterone compounded cream.   Valerian 500 MG CAPS Take 500 mg by mouth daily as needed (calming.).        Allergies:   Patient has no known allergies.    Social History         Socioeconomic History   Marital status: Married      Spouse name: Not on file   Number of children: Not on file   Years of education: Not on file   Highest education level: Not on file  Occupational History   Not on file  Tobacco Use   Smoking status: Some Days      Types: Cigars   Smokeless tobacco: Never  Substance and Sexual Activity   Alcohol use: Yes      Comment: OCC.   Drug use: No   Sexual activity: Not on file  Other Topics Concern   Not on file  Social History Narrative   Not on file    Social Determinants of Health    Financial Resource Strain: Not on file  Food Insecurity: Not on file  Transportation Needs: Not on file  Physical Activity: Not on file  Stress: Not on file  Social Connections: Not on file      Family History:  The patient's family history includes Alcoholism in his brother; Bladder Cancer in his father; Coronary artery disease (age of onset: 76) in his mother; Kidney failure in his mother.    ROS:   Please see the history of present illness.    ROS All other systems reviewed and are negative.   No flowsheet data found.         PHYSICAL EXAM:   VS:  BP 139/76   Pulse 73   Ht 6\' 1"  (1.854 m)   Wt 202 lb 6.4 oz (91.8 kg)   SpO2 97%   BMI 26.70 kg/m    GEN: Well nourished, well developed, in no acute distress  HEENT: normal  Neck: no JVD, carotid bruits, or masses Cardiac: RRR; no murmurs, rubs, or gallops,no edema.  Intact distal pulses bilaterally.  Respiratory:  clear to auscultation bilaterally, normal work of breathing GI: soft, nontender, nondistended, + BS MS: no deformity or atrophy  Skin: warm and dry, no rash Neuro:  Alert and Oriented x 3, Strength and sensation are intact Psych: euthymic  mood, full affect      Wt Readings from Last 3 Encounters:  02/23/21 202 lb 6.4 oz (91.8 kg)  12/27/19 194 lb (88 kg)  06/20/11 200 lb (90.7 kg)        Studies/Labs Reviewed:  EKG:  EKG is ordered today.  The ekg ordered today demonstrates NSR with LVH and repol   Recent Labs: No results found for requested labs within last 8760 hours.    Lipid Panel Labs (Brief)  No results found for: CHOL, TRIG, HDL, CHOLHDL, VLDL, LDLCALC, LDLDIRECT   Additional studies/ records that were reviewed today include:  OV notes from PCP       ASSESSMENT:     1. Chest pain of uncertain etiology   2. Hypertension associated with diabetes (HCC)   3. Pure hypercholesterolemia   4. DM type 2, goal HbA1c < 7% (HCC)         PLAN:  In order of problems listed above:   Unstable angina -he has had intermittent CP over the past 4 weeks and had a prolonged severe episode yesterday associated with SOB, nausea, diaphoresis and hypotension -he continues to have low grade chest discomfort today -EKG has nonspecific changes in setting of probable LVH -I am concerned that he may have had an ACS yesterday -will admit to tele bed today -start IV heparin gtt -continue ASA 81mg  daily and increase atorvastatin to 40mg  daily -add Lopressor 12.5mg  BID -cycle hsTrop -hold metformin for cath -make NPO after MN -plan LHC with possible PCI in am -Shared Decision Making/Informed Consent The risks [stroke (1 in 1000), death (1 in 1000), kidney failure [usually temporary] (1 in 500), bleeding (1 in 200), allergic reaction [possibly serious] (1 in 200)], benefits (diagnostic support and management of coronary artery disease) and alternatives of a cardiac catheterization were discussed in detail with Mr. Salminen and he is willing to proceed.   2.  HTN -BP controlled on exam today -hold ACE and diuretic for cath  -starting Lopressor 12.5mg  BID for possible ACS   3.  HLD -LDL goal < 70 if he has CAD -recent  lipids showed LDL 60, TAG 60 and HDL 35 -increase atorvastatin to 40mg  daily for possible ACS   4.  DM2 -check HbA1C -hold metformin for cath -cover with SS Insulin and BS check qac and hs     Time Spent: 30 minutes total time of encounter, including 20 minutes spent in face-to-face patient care on the date of this encounter. This time includes coordination of care and counseling regarding above mentioned problem list. Remainder of non-face-to-face time involved reviewing chart documents/testing relevant to the patient encounter and documentation in the medical record. I have independently reviewed documentation from referring provider   Medication Adjustments/Labs and Tests Ordered: Current medicines are reviewed at length with the patient today.  Concerns regarding medicines are outlined above.  Medication changes, Labs and Tests ordered today are listed in the Patient Instructions below.   There are no Patient Instructions on file for this visit.     Signed, , MD  02/23/2021 5:07 PM    [facility] [address] [place], [place], [address]  [place] Phone: [phone]; Fax: [phone]

## 2021-02-23 NOTE — H&P (View-Only) (Signed)
Cardiology H&P  Date:  02/23/2021   ID:  [name], DOB January 02, 1956, MRN [id number]  PCP:  Cleatis Polka., MD  Cardiologist:  Armanda Magic, MD   Chief Complaint  Patient presents with   New Patient (Initial Visit)    Chest pain, SOB, HTN, HLD     History of Present Illness:  Roger Fernandez is a 65 y.o. male who is being seen today for the evaluation of chest pain and SOB at the request of Cleatis Polka., MD.  This is a 65yo male with a hx of HLD, DM2 and HTN who is referred for SOB and chest pain. He was seen earlier today by his PCP for complaints of chest tightness that started around 5pm yesterday after working in the yard all morning.  Symptoms started after he had showered and was sitting resting.  The pressure lasted about an hour and was accompanied by diaphoersis, SOB and fatigue.  There was no radiation of the pain.  He drank some cold water and used a cold compress and his sx resolved after an hour but felt weak for the rest of the evening.  Today he has felt fatigued.  He apparently has been having intermittent chest tightness over the past month but not severe like yesterday and occurs at rest.  His BP was 83/69mmHg while having CP yesterday. He is now worked in as an urgent visit for evaluation.  He tells me that he still feels SOB and continues to have CP about 3/10.  He says that it is a sharp pain poking at his heart as well as tightness but not as bad.  He occasionally smokes a cigar.    Past Medical History:  Diagnosis Date   Hyperlipidemia associated with type 2 diabetes mellitus (HCC)    Hypertension    DR Martha Clan PCP  DR [facility] CARDIAC   Type 2 diabetes mellitus [facility])     Past Surgical History:  Procedure Laterality Date   ANTERIOR CERVICAL DECOMP/DISCECTOMY FUSION  06/06/2011   Procedure: ANTERIOR CERVICAL DECOMPRESSION/DISCECTOMY FUSION 3 LEVELS;  Surgeon: Hewitt Shorts;  Location: [facility];  Service: Neurosurgery;  Laterality:  N/A;  Cervical Three-four,Cervical Four-Five,Cervical Five-Six anterior cervical decompression with fusion plating and bonegraft   APPENDECTOMY     TONSILLECTOMY      Current Medications: Current Meds  Medication Sig   Apoaequorin (PREVAGEN PO) Take 1 capsule by mouth daily.   aspirin EC 81 MG tablet Take 81 mg by mouth daily. Swallow whole.   atorvastatin (LIPITOR) 20 MG tablet Take 20 mg by mouth daily.   Cascara Sagrada 450 MG CAPS Take 450 mg by mouth at bedtime.   diphenhydramine-acetaminophen (TYLENOL PM) 25-500 MG TABS tablet Take 1 tablet by mouth at bedtime as needed (sleep.).   lisinopril-hydrochlorothiazide (PRINZIDE,ZESTORETIC) 20-12.5 MG per tablet Take 1 tablet by mouth daily.    metFORMIN (GLUCOPHAGE) 500 MG tablet Take 500 mg by mouth 2 (two) times daily.   PRESCRIPTION MEDICATION Apply 1 application topically daily. Testosterone compounded cream.   Valerian 500 MG CAPS Take 500 mg by mouth daily as needed (calming.).    Allergies:   Patient has no known allergies.   Social History   Socioeconomic History   Marital status: Married    Spouse name: Not on file   Number of children: Not on file   Years of education: Not on file   Highest education level: Not on  file  Occupational History   Not on file  Tobacco Use   Smoking status: Some Days    Types: Cigars   Smokeless tobacco: Never  Substance and Sexual Activity   Alcohol use: Yes    Comment: OCC.   Drug use: No   Sexual activity: Not on file  Other Topics Concern   Not on file  Social History Narrative   Not on file   Social Determinants of Health   Financial Resource Strain: Not on file  Food Insecurity: Not on file  Transportation Needs: Not on file  Physical Activity: Not on file  Stress: Not on file  Social Connections: Not on file     Family History:  The patient's family history includes Alcoholism in his brother; Bladder Cancer in his father; Coronary artery disease (age of onset: 82) in  his mother; Kidney failure in his mother.   ROS:   Please see the history of present illness.    ROS All other systems reviewed and are negative.  No flowsheet data found.     PHYSICAL EXAM:   VS:  BP 139/76   Pulse 73   Ht 6\' 1"  (1.854 m)   Wt 202 lb 6.4 oz (91.8 kg)   SpO2 97%   BMI 26.70 kg/m    GEN: Well nourished, well developed, in no acute distress  HEENT: normal  Neck: no JVD, carotid bruits, or masses Cardiac: RRR; no murmurs, rubs, or gallops,no edema.  Intact distal pulses bilaterally.  Respiratory:  clear to auscultation bilaterally, normal work of breathing GI: soft, nontender, nondistended, + BS MS: no deformity or atrophy  Skin: warm and dry, no rash Neuro:  Alert and Oriented x 3, Strength and sensation are intact Psych: euthymic mood, full affect  Wt Readings from Last 3 Encounters:  02/23/21 202 lb 6.4 oz (91.8 kg)  12/27/19 194 lb (88 kg)  06/20/11 200 lb (90.7 kg)      Studies/Labs Reviewed:   EKG:  EKG is ordered today.  The ekg ordered today demonstrates NSR with LVH and repol  Recent Labs: No results found for requested labs within last 8760 hours.   Lipid Panel No results found for: CHOL, TRIG, HDL, CHOLHDL, VLDL, LDLCALC, LDLDIRECT Additional studies/ records that were reviewed today include:  OV notes from PCP    ASSESSMENT:    1. Chest pain of uncertain etiology   2. Hypertension associated with diabetes (HCC)   3. Pure hypercholesterolemia   4. DM type 2, goal HbA1c < 7% (HCC)      PLAN:  In order of problems listed above:  Unstable angina -he has had intermittent CP over the past 4 weeks and had a prolonged severe episode yesterday associated with SOB, nausea, diaphoresis and hypotension -he continues to have low grade chest discomfort today -EKG has nonspecific changes in setting of probable LVH -I am concerned that he may have had an ACS yesterday -will admit to tele bed today -start IV heparin gtt -continue ASA 81mg   daily and increase atorvastatin to 40mg  daily -add Lopressor 12.5mg  BID -cycle hsTrop -hold metformin for cath -make NPO after MN -plan LHC with possible PCI in am -Shared Decision Making/Informed Consent The risks [stroke (1 in 1000), death (1 in 1000), kidney failure [usually temporary] (1 in 500), bleeding (1 in 200), allergic reaction [possibly serious] (1 in 200)], benefits (diagnostic support and management of coronary artery disease) and alternatives of a cardiac catheterization were discussed in detail with Mr. 06/22/11  and he is willing to proceed.  2.  HTN -BP controlled on exam today -hold ACE and diuretic for cath  -starting Lopressor 12.5mg  BID for possible ACS  3.  HLD -LDL goal < 70 if he has CAD -recent lipids showed LDL 60, TAG 60 and HDL 35 -increase atorvastatin to 40mg  daily for possible ACS  4.  DM2 -check HbA1C -hold metformin for cath -cover with SS Insulin and BS check qac and hs   Time Spent: 30 minutes total time of encounter, including 20 minutes spent in face-to-face patient care on the date of this encounter. This time includes coordination of care and counseling regarding above mentioned problem list. Remainder of non-face-to-face time involved reviewing chart documents/testing relevant to the patient encounter and documentation in the medical record. I have independently reviewed documentation from referring provider  Medication Adjustments/Labs and Tests Ordered: Current medicines are reviewed at length with the patient today.  Concerns regarding medicines are outlined above.  Medication changes, Labs and Tests ordered today are listed in the Patient Instructions below.  There are no Patient Instructions on file for this visit.   Signed, , MD  02/23/2021 5:07 PM    [facility] [address] [place], [place], [place]  [place] Phone: [phone]; Fax: [phone]

## 2021-02-23 NOTE — Progress Notes (Addendum)
Cardiology H&P  Date:  02/23/2021   ID:  [name], DOB January 02, 1956, MRN [id number]  PCP:  Cleatis Polka., MD  Cardiologist:  Armanda Magic, MD   Chief Complaint  Patient presents with   New Patient (Initial Visit)    Chest pain, SOB, HTN, HLD     History of Present Illness:  Roger Fernandez is a 65 y.o. male who is being seen today for the evaluation of chest pain and SOB at the request of Cleatis Polka., MD.  This is a 65yo male with a hx of HLD, DM2 and HTN who is referred for SOB and chest pain. He was seen earlier today by his PCP for complaints of chest tightness that started around 5pm yesterday after working in the yard all morning.  Symptoms started after he had showered and was sitting resting.  The pressure lasted about an hour and was accompanied by diaphoersis, SOB and fatigue.  There was no radiation of the pain.  He drank some cold water and used a cold compress and his sx resolved after an hour but felt weak for the rest of the evening.  Today he has felt fatigued.  He apparently has been having intermittent chest tightness over the past month but not severe like yesterday and occurs at rest.  His BP was 83/69mmHg while having CP yesterday. He is now worked in as an urgent visit for evaluation.  He tells me that he still feels SOB and continues to have CP about 3/10.  He says that it is a sharp pain poking at his heart as well as tightness but not as bad.  He occasionally smokes a cigar.    Past Medical History:  Diagnosis Date   Hyperlipidemia associated with type 2 diabetes mellitus (HCC)    Hypertension    DR Martha Clan PCP  DR [facility] CARDIAC   Type 2 diabetes mellitus [facility])     Past Surgical History:  Procedure Laterality Date   ANTERIOR CERVICAL DECOMP/DISCECTOMY FUSION  06/06/2011   Procedure: ANTERIOR CERVICAL DECOMPRESSION/DISCECTOMY FUSION 3 LEVELS;  Surgeon: Hewitt Shorts;  Location: [facility] NEURO ORS;  Service: Neurosurgery;  Laterality:  N/A;  Cervical Three-four,Cervical Four-Five,Cervical Five-Six anterior cervical decompression with fusion plating and bonegraft   APPENDECTOMY     TONSILLECTOMY      Current Medications: Current Meds  Medication Sig   Apoaequorin (PREVAGEN PO) Take 1 capsule by mouth daily.   aspirin EC 81 MG tablet Take 81 mg by mouth daily. Swallow whole.   atorvastatin (LIPITOR) 20 MG tablet Take 20 mg by mouth daily.   C[place] 450 MG CAPS Take 450 mg by mouth at bedtime.   diphenhydramine-acetaminophen (TYLENOL PM) 25-500 MG TABS tablet Take 1 tablet by mouth at bedtime as needed (sleep.).   lisinopril-hydrochlorothiazide (PRINZIDE,ZESTORETIC) 20-12.5 MG per tablet Take 1 tablet by mouth daily.    metFORMIN (GLUCOPHAGE) 500 MG tablet Take 500 mg by mouth 2 (two) times daily.   PRESCRIPTION MEDICATION Apply 1 application topically daily. Testosterone compounded cream.   Valerian 500 MG CAPS Take 500 mg by mouth daily as needed (calming.).    Allergies:   Patient has no known allergies.   Social History   Socioeconomic History   Marital status: Married    Spouse name: Not on file   Number of children: Not on file   Years of education: Not on file   Highest education level: Not on  file  Occupational History   Not on file  Tobacco Use   Smoking status: Some Days    Types: Cigars   Smokeless tobacco: Never  Substance and Sexual Activity   Alcohol use: Yes    Comment: OCC.   Drug use: No   Sexual activity: Not on file  Other Topics Concern   Not on file  Social History Narrative   Not on file   Social Determinants of Health   Financial Resource Strain: Not on file  Food Insecurity: Not on file  Transportation Needs: Not on file  Physical Activity: Not on file  Stress: Not on file  Social Connections: Not on file     Family History:  The patient's family history includes Alcoholism in his brother; Bladder Cancer in his father; Coronary artery disease (age of onset: 82) in  his mother; Kidney failure in his mother.   ROS:   Please see the history of present illness.    ROS All other systems reviewed and are negative.  No flowsheet data found.     PHYSICAL EXAM:   VS:  BP 139/76   Pulse 73   Ht 6\' 1"  (1.854 m)   Wt 202 lb 6.4 oz (91.8 kg)   SpO2 97%   BMI 26.70 kg/m    GEN: Well nourished, well developed, in no acute distress  HEENT: normal  Neck: no JVD, carotid bruits, or masses Cardiac: RRR; no murmurs, rubs, or gallops,no edema.  Intact distal pulses bilaterally.  Respiratory:  clear to auscultation bilaterally, normal work of breathing GI: soft, nontender, nondistended, + BS MS: no deformity or atrophy  Skin: warm and dry, no rash Neuro:  Alert and Oriented x 3, Strength and sensation are intact Psych: euthymic mood, full affect  Wt Readings from Last 3 Encounters:  02/23/21 202 lb 6.4 oz (91.8 kg)  12/27/19 194 lb (88 kg)  06/20/11 200 lb (90.7 kg)      Studies/Labs Reviewed:   EKG:  EKG is ordered today.  The ekg ordered today demonstrates NSR with LVH and repol  Recent Labs: No results found for requested labs within last 8760 hours.   Lipid Panel No results found for: CHOL, TRIG, HDL, CHOLHDL, VLDL, LDLCALC, LDLDIRECT Additional studies/ records that were reviewed today include:  OV notes from PCP    ASSESSMENT:    1. Chest pain of uncertain etiology   2. Hypertension associated with diabetes (HCC)   3. Pure hypercholesterolemia   4. DM type 2, goal HbA1c < 7% (HCC)      PLAN:  In order of problems listed above:  Unstable angina -he has had intermittent CP over the past 4 weeks and had a prolonged severe episode yesterday associated with SOB, nausea, diaphoresis and hypotension -he continues to have low grade chest discomfort today -EKG has nonspecific changes in setting of probable LVH -I am concerned that he may have had an ACS yesterday -will admit to tele bed today -start IV heparin gtt -continue ASA 81mg   daily and increase atorvastatin to 40mg  daily -add Lopressor 12.5mg  BID -cycle hsTrop -hold metformin for cath -make NPO after MN -plan LHC with possible PCI in am -Shared Decision Making/Informed Consent The risks [stroke (1 in 1000), death (1 in 1000), kidney failure [usually temporary] (1 in 500), bleeding (1 in 200), allergic reaction [possibly serious] (1 in 200)], benefits (diagnostic support and management of coronary artery disease) and alternatives of a cardiac catheterization were discussed in detail with Mr. 06/22/11  and he is willing to proceed.  2.  HTN -BP controlled on exam today -hold ACE and diuretic for cath  -starting Lopressor 12.5mg  BID for possible ACS  3.  HLD -LDL goal < 70 if he has CAD -recent lipids showed LDL 60, TAG 60 and HDL 35 -increase atorvastatin to 40mg  daily for possible ACS  4.  DM2 -check HbA1C -hold metformin for cath -cover with SS Insulin and BS check qac and hs   Time Spent: 30 minutes total time of encounter, including 20 minutes spent in face-to-face patient care on the date of this encounter. This time includes coordination of care and counseling regarding above mentioned problem list. Remainder of non-face-to-face time involved reviewing chart documents/testing relevant to the patient encounter and documentation in the medical record. I have independently reviewed documentation from referring provider  Medication Adjustments/Labs and Tests Ordered: Current medicines are reviewed at length with the patient today.  Concerns regarding medicines are outlined above.  Medication changes, Labs and Tests ordered today are listed in the Patient Instructions below.  There are no Patient Instructions on file for this visit.   Signed, , MD  02/23/2021 5:07 PM    [facility] [address] [place], [place], [place]  [place] Phone: [phone]; Fax: [phone]

## 2021-02-24 ENCOUNTER — Other Ambulatory Visit (HOSPITAL_COMMUNITY): Payer: Self-pay

## 2021-02-24 ENCOUNTER — Inpatient Hospital Stay (HOSPITAL_COMMUNITY): Payer: Medicare Other

## 2021-02-24 ENCOUNTER — Encounter (HOSPITAL_COMMUNITY): Payer: Self-pay | Admitting: Interventional Cardiology

## 2021-02-24 ENCOUNTER — Ambulatory Visit (HOSPITAL_COMMUNITY)
Admission: RE | Admit: 2021-02-24 | Payer: Medicare Other | Source: Home / Self Care | Admitting: Interventional Cardiology

## 2021-02-24 ENCOUNTER — Encounter (HOSPITAL_COMMUNITY)
Admission: AD | Disposition: A | Discharge status: Home / Self Care | Payer: Self-pay | Source: Ambulatory Visit | Attending: Cardiology

## 2021-02-24 DIAGNOSIS — E1159 Type 2 diabetes mellitus with other circulatory complications: Secondary | ICD-10-CM | POA: Diagnosis not present

## 2021-02-24 DIAGNOSIS — Z20822 Contact with and (suspected) exposure to covid-19: Secondary | ICD-10-CM | POA: Diagnosis not present

## 2021-02-24 DIAGNOSIS — R079 Chest pain, unspecified: Secondary | ICD-10-CM

## 2021-02-24 DIAGNOSIS — I200 Unstable angina: Principal | ICD-10-CM

## 2021-02-24 DIAGNOSIS — E1169 Type 2 diabetes mellitus with other specified complication: Secondary | ICD-10-CM

## 2021-02-24 DIAGNOSIS — I2510 Atherosclerotic heart disease of native coronary artery without angina pectoris: Secondary | ICD-10-CM | POA: Diagnosis not present

## 2021-02-24 DIAGNOSIS — E785 Hyperlipidemia, unspecified: Secondary | ICD-10-CM

## 2021-02-24 DIAGNOSIS — I152 Hypertension secondary to endocrine disorders: Secondary | ICD-10-CM

## 2021-02-24 DIAGNOSIS — E119 Type 2 diabetes mellitus without complications: Secondary | ICD-10-CM

## 2021-02-24 HISTORY — PX: LEFT HEART CATH AND CORONARY ANGIOGRAPHY: CATH118249

## 2021-02-24 LAB — BASIC METABOLIC PANEL
Anion gap: 7 (ref 5–15)
BUN: 11 mg/dL (ref 8–23)
CO2: 26 mmol/L (ref 22–32)
Calcium: 9.3 mg/dL (ref 8.9–10.3)
Chloride: 104 mmol/L (ref 98–111)
Creatinine, Ser: 0.9 mg/dL (ref 0.61–1.24)
GFR, Estimated: >60 mL/min (ref >60)
Glucose, Bld: 117 mg/dL — ABNORMAL HIGH (ref 70–99)
Potassium: 3.4 mmol/L — ABNORMAL LOW (ref 3.5–5.1)
Sodium: 137 mmol/L (ref 135–145)

## 2021-02-24 LAB — GLUCOSE, CAPILLARY
Glucose-Capillary: 125 mg/dL — ABNORMAL HIGH (ref 70–99)
Glucose-Capillary: 102 mg/dL — ABNORMAL HIGH (ref 70–99)
Glucose-Capillary: 83 mg/dL (ref 70–99)

## 2021-02-24 LAB — CBC
HCT: 41.1 % (ref 39.0–52.0)
Hemoglobin: 14.1 g/dL (ref 13.0–17.0)
MCH: 30.3 pg (ref 26.0–34.0)
MCHC: 34.3 g/dL (ref 30.0–36.0)
MCV: 88.2 fL (ref 80.0–100.0)
Platelets: 243 10*3/uL (ref 150–400)
RBC: 4.66 MIL/uL (ref 4.22–5.81)
RDW: 13.2 % (ref 11.5–15.5)
WBC: 9.4 10*3/uL (ref 4.0–10.5)
nRBC: 0 % (ref 0.0–0.2)

## 2021-02-24 LAB — ECHOCARDIOGRAM COMPLETE
Area-P 1/2: 2.91 cm2
Height: 73 in
S' Lateral: 3 cm
Weight: 3167.57 oz

## 2021-02-24 LAB — HEPARIN LEVEL (UNFRACTIONATED): Heparin Unfractionated: 0.14 IU/mL — ABNORMAL LOW (ref 0.30–0.70)

## 2021-02-24 LAB — LIPID PANEL
Cholesterol: 99 mg/dL (ref 0–200)
HDL: 33 mg/dL — ABNORMAL LOW (ref >40)
LDL Cholesterol: 34 mg/dL (ref 0–99)
Total CHOL/HDL Ratio: 3 RATIO
Triglycerides: 159 mg/dL — ABNORMAL HIGH (ref <150)
VLDL: 32 mg/dL (ref 0–40)

## 2021-02-24 LAB — SARS CORONAVIRUS 2 (TAT 6-24 HRS): SARS Coronavirus 2: NEGATIVE

## 2021-02-24 SURGERY — LEFT HEART CATH AND CORONARY ANGIOGRAPHY
Anesthesia: LOCAL

## 2021-02-24 MED ORDER — SODIUM CHLORIDE 0.9 % IV SOLN: 250.0000 mL | INTRAVENOUS | Status: DC | PRN

## 2021-02-24 MED ORDER — OXYCODONE HCL 5 MG PO TABS: 5.0000 mg | ORAL_TABLET | ORAL | Status: DC | PRN

## 2021-02-24 MED ORDER — SODIUM CHLORIDE 0.9% FLUSH: 3.0000 mL | Freq: Two times a day (BID) | INTRAVENOUS | Status: DC

## 2021-02-24 MED ORDER — SODIUM CHLORIDE 0.9% FLUSH: 3.0000 mL | INTRAVENOUS | Status: DC | PRN

## 2021-02-24 MED ORDER — IOHEXOL 350 MG/ML SOLN
INTRAVENOUS | Status: DC | PRN
  Administered 2021-02-24: 85 mL

## 2021-02-24 MED ORDER — NITROGLYCERIN 0.4 MG SL SUBL
0.4000 mg | SUBLINGUAL_TABLET | SUBLINGUAL | 2 refills | Status: DC | PRN
Start: 2021-02-24 — End: 2022-11-08
  Filled 2021-02-24: qty 25, 8d supply, fill #0

## 2021-02-24 MED ORDER — ONDANSETRON HCL 4 MG/2ML IJ SOLN: 4.0000 mg | Freq: Four times a day (QID) | INTRAMUSCULAR | Status: DC | PRN

## 2021-02-24 MED ORDER — SODIUM CHLORIDE 0.9 % IV SOLN: INTRAVENOUS | Status: AC

## 2021-02-24 MED ORDER — LABETALOL HCL 5 MG/ML IV SOLN: 10.0000 mg | INTRAVENOUS | Status: AC | PRN

## 2021-02-24 MED ORDER — ASPIRIN 81 MG PO CHEW: 81.0000 mg | CHEWABLE_TABLET | Freq: Every day | ORAL | Status: DC

## 2021-02-24 MED ORDER — HEPARIN BOLUS VIA INFUSION
2000.0000 [IU] | Freq: Once | INTRAVENOUS | Status: AC
  Administered 2021-02-24: 2000 [IU] via INTRAVENOUS
  Filled 2021-02-24: qty 2000

## 2021-02-24 MED ORDER — METOPROLOL TARTRATE 25 MG PO TABS
12.5000 mg | ORAL_TABLET | Freq: Two times a day (BID) | ORAL | 0 refills | Status: DC
  Filled 2021-02-24: qty 30, 30d supply, fill #0

## 2021-02-24 MED ORDER — HEPARIN SODIUM (PORCINE) 1000 UNIT/ML IJ SOLN
INTRAMUSCULAR | Status: DC | PRN
  Administered 2021-02-24: 5000 [IU] via INTRAVENOUS

## 2021-02-24 MED ORDER — HEPARIN SODIUM (PORCINE) 1000 UNIT/ML IJ SOLN
INTRAMUSCULAR | Status: AC
  Filled 2021-02-24: qty 1

## 2021-02-24 MED ORDER — ATORVASTATIN CALCIUM 40 MG PO TABS: 40.0000 mg | ORAL_TABLET | Freq: Every day | ORAL | Status: DC

## 2021-02-24 MED ORDER — VERAPAMIL HCL 2.5 MG/ML IV SOLN
INTRAVENOUS | Status: AC
  Filled 2021-02-24: qty 2

## 2021-02-24 MED ORDER — LIDOCAINE HCL (PF) 1 % IJ SOLN
INTRAMUSCULAR | Status: DC | PRN
  Administered 2021-02-24: 2 mL

## 2021-02-24 MED ORDER — FENTANYL CITRATE (PF) 100 MCG/2ML IJ SOLN
INTRAMUSCULAR | Status: DC | PRN
  Administered 2021-02-24: 50 ug via INTRAVENOUS
  Administered 2021-02-24: 25 ug via INTRAVENOUS

## 2021-02-24 MED ORDER — LIDOCAINE HCL (PF) 1 % IJ SOLN
INTRAMUSCULAR | Status: AC
  Filled 2021-02-24: qty 30

## 2021-02-24 MED ORDER — ACETAMINOPHEN 325 MG PO TABS: 650.0000 mg | ORAL_TABLET | ORAL | Status: DC | PRN

## 2021-02-24 MED ORDER — MIDAZOLAM HCL 2 MG/2ML IJ SOLN
INTRAMUSCULAR | Status: AC
  Filled 2021-02-24: qty 2

## 2021-02-24 MED ORDER — HEPARIN SODIUM (PORCINE) 5000 UNIT/ML IJ SOLN: 5000.0000 [IU] | Freq: Three times a day (TID) | INTRAMUSCULAR | Status: DC

## 2021-02-24 MED ORDER — MIDAZOLAM HCL 2 MG/2ML IJ SOLN
INTRAMUSCULAR | Status: DC | PRN
  Administered 2021-02-24: 0.5 mg via INTRAVENOUS
  Administered 2021-02-24: 1 mg via INTRAVENOUS

## 2021-02-24 MED ORDER — HYDRALAZINE HCL 20 MG/ML IJ SOLN: 10.0000 mg | INTRAMUSCULAR | Status: AC | PRN

## 2021-02-24 MED ORDER — FENTANYL CITRATE (PF) 100 MCG/2ML IJ SOLN
INTRAMUSCULAR | Status: AC
  Filled 2021-02-24: qty 2

## 2021-02-24 MED ORDER — HEPARIN (PORCINE) IN NACL 1000-0.9 UT/500ML-% IV SOLN
INTRAVENOUS | Status: AC
  Filled 2021-02-24: qty 1000

## 2021-02-24 MED ORDER — HEPARIN (PORCINE) IN NACL 1000-0.9 UT/500ML-% IV SOLN
INTRAVENOUS | Status: DC | PRN
  Administered 2021-02-24 (×2): 500 mL

## 2021-02-24 MED ORDER — VERAPAMIL HCL 2.5 MG/ML IV SOLN
INTRAVENOUS | Status: DC | PRN
  Administered 2021-02-24: 10 mL via INTRA_ARTERIAL

## 2021-02-24 MED ORDER — POTASSIUM CHLORIDE CRYS ER 20 MEQ PO TBCR
40.0000 meq | EXTENDED_RELEASE_TABLET | Freq: Once | ORAL | Status: AC
  Administered 2021-02-24: 40 meq via ORAL
  Filled 2021-02-24: qty 2

## 2021-02-24 SURGICAL SUPPLY — 10 items
CATH 5FR JL3.5 JR4 ANG PIG MP (CATHETERS) ×2 IMPLANT
DEVICE RAD COMP TR BAND LRG (VASCULAR PRODUCTS) ×4 IMPLANT
GLIDESHEATH SLEND A-KIT 6F 22G (SHEATH) ×2 IMPLANT
GUIDEWIRE INQWIRE 1.5J.035X260 (WIRE) ×1 IMPLANT
INQWIRE 1.5J .035X260CM (WIRE) ×2
KIT HEART LEFT (KITS) ×2 IMPLANT
PACK CARDIAC CATHETERIZATION (CUSTOM PROCEDURE TRAY) ×2 IMPLANT
SHEATH PROBE COVER 6X72 (BAG) ×2 IMPLANT
TRANSDUCER W/STOPCOCK (MISCELLANEOUS) ×2 IMPLANT
TUBING CIL FLEX 10 FLL-RA (TUBING) ×2 IMPLANT

## 2021-02-24 NOTE — Progress Notes (Signed)
Pt to Cath Lab

## 2021-02-24 NOTE — Progress Notes (Signed)
TR band at 14cc. RN attempted to removed 2 cc. Oozing from R radial site. Reinstilled 2 cc. Charge RN [employer] notified. Cath lab called.   Cath lab suggestions: Wait 30 minutes, remove 1 cc every 10 minutes. Call for uncontrolled bleeding or hematoma.

## 2021-02-24 NOTE — Plan of Care (Signed)
  Problem: Clinical Measurements: Goal: Cardiovascular complication will be avoided Outcome: Not Progressing   

## 2021-02-24 NOTE — Progress Notes (Signed)
  Echocardiogram 2D Echocardiogram has been performed.  Augustine Radar 02/24/2021, 9:31 AM

## 2021-02-24 NOTE — Progress Notes (Signed)
Progress Note  Patient Name: Roger Fernandez Date of Encounter: 02/24/2021  [facility] Cardiologist: None New- Dr Mayford Knife  Subjective   Feels well. Some HA on Ntg. No chest pain  Inpatient Medications    Scheduled Meds:  [START ON 02/25/2021] aspirin EC  81 mg Oral Daily   atorvastatin  40 mg Oral Daily   insulin aspart  0-9 Units Subcutaneous TID WC   metoprolol tartrate  12.5 mg Oral BID   potassium chloride  40 mEq Oral Once   sodium chloride flush  3 mL Intravenous Q12H   Continuous Infusions:  sodium chloride     sodium chloride     sodium chloride 1 mL/kg/hr (02/24/21 0507)   heparin 1,350 Units/hr (02/24/21 0407)   nitroGLYCERIN 5 mcg/min (02/23/21 2042)   PRN Meds: sodium chloride, sodium chloride, acetaminophen, nitroGLYCERIN, ondansetron (ZOFRAN) IV, sodium chloride flush, sodium chloride flush   Vital Signs    Vitals:   02/23/21 2300 02/24/21 0300 02/24/21 0404 02/24/21 0708  BP: 116/67 115/73  121/75  Pulse: 63 67  62  Resp: 17 16  11   Temp: 98 F (36.7 C) 97.8 F (36.6 C)  97.8 F (36.6 C)  TempSrc: Oral Oral  Oral  SpO2: 96% 97%  98%  Weight:   89.8 kg   Height:        Intake/Output Summary (Last 24 hours) at 02/24/2021 0819 Last data filed at 02/24/2021 0507 Gross per 24 hour  Intake 709.26 ml  Output --  Net 709.26 ml   Last 3 Weights 02/24/2021 02/23/2021 02/23/2021  Weight (lbs) 197 lb 15.6 oz 198 lb 3.1 oz 202 lb 6.4 oz  Weight (kg) 89.8 kg 89.9 kg 91.808 kg      Telemetry    NSR - Personally Reviewed  ECG    NSR with LVH. No acute ST-T changes - Personally Reviewed  Physical Exam   GEN: No acute distress.   Neck: No JVD Cardiac: RRR, no murmurs, rubs, or gallops.  Respiratory: Clear to auscultation bilaterally. GI: Soft, nontender, non-distended  MS: No edema; No deformity. Neuro:  Nonfocal  Psych: Normal affect   Labs    High Sensitivity Troponin:   Recent Labs  Lab 02/23/21 1901 02/23/21 2024  TROPONINIHS 5 5       Chemistry Recent Labs  Lab 02/23/21 1901 02/24/21 0259  NA 135 137  K 4.0 3.4*  CL 101 104  CO2 23 26  GLUCOSE 85 117*  BUN 11 11  CREATININE 0.92 0.90  CALCIUM 9.6 9.3  PROT 6.0*  --   ALBUMIN 3.7  --   AST 20  --   ALT 24  --   ALKPHOS 37*  --   BILITOT 0.7  --   GFRNONAA >60 >60  ANIONGAP 11 7     Hematology Recent Labs  Lab 02/23/21 1901 02/24/21 0259  WBC 10.3 9.4  RBC 4.86 4.66  HGB 14.5 14.1  HCT 42.2 41.1  MCV 86.8 88.2  MCH 29.8 30.3  MCHC 34.4 34.3  RDW 13.2 13.2  PLT 247 243    BNPNo results for input(s): BNP, PROBNP in the last 168 hours.   DDimer No results for input(s): DDIMER in the last 168 hours.   Radiology    DG CHEST PORT 1 VIEW  Result Date: 02/23/2021 CLINICAL DATA:  Chest pain EXAM: PORTABLE CHEST 1 VIEW COMPARISON:  12/27/2019 FINDINGS: The heart size and mediastinal contours are within normal limits. Both lungs are clear.  Hardware in the cervical spine. IMPRESSION: No active disease. Electronically Signed   By: Jasmine Pang M.D.   On: 02/23/2021 20:48    Cardiac Studies   none  Patient Profile     65 y.o. male with history of HTN, DM, HLD presents with progressive chest pain.   Assessment & Plan    Unstable angina -he has had intermittent CP over the past 4 weeks and had a prolonged severe episode Monday associated with SOB, nausea, diaphoresis and hypotension -CP resolved.  -EKG has nonspecific changes in setting of probable LVH -continue IV heparin and Ntg -continue ASA 81mg  daily and increase atorvastatin to 40mg  daily -add Lopressor 12.5mg  BID - troponin negative -hold metformin for cath -Plan LHC today with possible PCI -Shared Decision Making/Informed Consent The risks [stroke (1 in 1000), death (1 in 1000), kidney failure [usually temporary] (1 in 500), bleeding (1 in 200), allergic reaction [possibly serious] (1 in 200)], benefits (diagnostic support and management of coronary artery disease) and alternatives  of a cardiac catheterization were discussed in detail with Mr. Carbajal and he is willing to proceed.   2.  HTN -BP controlled on exam today -hold ACE and diuretic for cath  -starting Lopressor 12.5mg  BID for possible ACS   3.  HLD -LDL is at goal 34.  -increase atorvastatin to 40mg  daily for possible ACS   4.  DM2 -HbA1C 6.8% -hold metformin for cath -cover with SS Insulin and BS check qac and hs      For questions or updates, please contact [facility] Please consult [email or web address] for contact info under        Signed, Zaeda Mcferran , MD  02/24/2021, 8:19 AM

## 2021-02-24 NOTE — Progress Notes (Signed)
ANTICOAGULATION CONSULT NOTE  Pharmacy Consult for IV Heparin Indication: chest pain/ACS  No Known Allergies  Patient Measurements: Height: 73 inches TBW 89.9 kg   Vital Signs: Temp: 98 F (36.7 C) (09/06 2300) Temp Source: Oral (09/06 2300) BP: 115/73 (09/07 0300) Pulse Rate: 67 (09/07 0300)  Labs: Recent Labs    02/23/21 1901 02/23/21 2024 02/24/21 0259  HGB 14.5  --  14.1  HCT 42.2  --  41.1  PLT 247  --  243  HEPARINUNFRC <0.10*  --  0.14*  CREATININE 0.92  --   --   TROPONINIHS 5 5  --      Estimated Creatinine Clearance: 89.3 mL/min (by C-G formula based on SCr of 0.92 mg/dL).   Medical History: Past Medical History:  Diagnosis Date   Hyperlipidemia associated with type 2 diabetes mellitus (HCC)    Hypertension    DR Martha Clan PCP  DR [facility] CARDIAC   Type 2 diabetes mellitus [facility])     Assessment: 65 yr old man admitted for evaluation of CP, SOB, unstable angina. Pharmacy is consulted for IV heparin dosing. Pt was on no anticoagulants PTA. Planning for cardiac cath today.  Heparin level subtherapeutic (0.14) on gtt at 1100 units/hr. No issues with line or bleeding reported per RN.  Goal of Therapy:  Heparin level 0.3-0.7 units/ml Monitor platelets by anticoagulation protocol: Yes   Plan: Rebolus 2000 units and increase heparin infusion to 1350 units/hr Will f/u post cath  Christoper Fabian, PharmD, BCPS Please see amion for complete clinical pharmacist phone list 02/24/2021,3:59 AM

## 2021-02-24 NOTE — CV Procedure (Signed)
Diffuse LAD disease less than 50%.  Second diagonal, relatively small with ostial 80% narrowing. No significant obstructive disease noted angiographically. LV function is normal.  EDP is 19 on IV nitroglycerin.  Recommend aggressive risk factor modification, consideration of alternative explanation for chest pain, and at this point cannot exclude the possibility of microvascular dysfunction.

## 2021-02-24 NOTE — Interval H&P Note (Signed)
Cath Lab Visit (complete for each Cath Lab visit)  Clinical Evaluation Leading to the Procedure:   ACS: Yes.    Non-ACS:    Anginal Classification: CCS IV  Anti-ischemic medical therapy: Minimal Therapy (1 class of medications)  Non-Invasive Test Results: No non-invasive testing performed  Prior CABG: No previous CABG      History and Physical Interval Note:  02/24/2021 7:44 AM  Roger Fernandez  has presented today for surgery, with the diagnosis of chest pain.  The various methods of treatment have been discussed with the patient and family. After consideration of risks, benefits and other options for treatment, the patient has consented to  Procedure(s): LEFT HEART CATH AND CORONARY ANGIOGRAPHY (N/A) as a surgical intervention.  The patient's history has been reviewed, patient examined, no change in status, stable for surgery.  I have reviewed the patient's chart and labs.  Questions were answered to the patient's satisfaction.     Lyn Records III

## 2021-02-24 NOTE — TOC Benefit Eligibility Note (Signed)
Patient Product/process development scientist completed.    The patient does not have any prescription coverage  Roland Earl, CPhT Pharmacy Patient Advocate Specialist [facility] Antimicrobial Stewardship Team Direct Number: [phone]  Fax: [phone]

## 2021-02-24 NOTE — Progress Notes (Signed)
TR band at 14cc. RN attempted to removed 1 cc. Oozing from R radial site. Reinstilled 1 cc. Charge RN [employer] notified. Cath lab called.    Cath lab: someone en route shortly.

## 2021-02-24 NOTE — Plan of Care (Signed)
  Problem: Education: Goal: Knowledge of General Education information will improve Description: Including pain rating scale, medication(s)/side effects and non-pharmacologic comfort measures 02/24/2021 1850 by Grover Canavan, RN Outcome: Adequate for Discharge 02/24/2021 1024 by Grover Canavan, RN Outcome: Progressing   Problem: Health Behavior/Discharge Planning: Goal: Ability to manage health-related needs will improve 02/24/2021 1850 by Grover Canavan, RN Outcome: Adequate for Discharge 02/24/2021 1024 by Grover Canavan, RN Outcome: Progressing   Problem: Clinical Measurements: Goal: Ability to maintain clinical measurements within normal limits will improve 02/24/2021 1850 by Grover Canavan, RN Outcome: Adequate for Discharge 02/24/2021 1024 by Grover Canavan, RN Outcome: Progressing Goal: Will remain free from infection 02/24/2021 1850 by Grover Canavan, RN Outcome: Adequate for Discharge 02/24/2021 1024 by Grover Canavan, RN Outcome: Progressing Goal: Diagnostic test results will improve 02/24/2021 1850 by Grover Canavan, RN Outcome: Adequate for Discharge 02/24/2021 1024 by Grover Canavan, RN Outcome: Progressing Goal: Respiratory complications will improve 02/24/2021 1850 by Grover Canavan, RN Outcome: Adequate for Discharge 02/24/2021 1024 by Grover Canavan, RN Outcome: Progressing Goal: Cardiovascular complication will be avoided 02/24/2021 1850 by Grover Canavan, RN Outcome: Adequate for Discharge 02/24/2021 1024 by Grover Canavan, RN Outcome: Not Progressing   Problem: Activity: Goal: Risk for activity intolerance will decrease 02/24/2021 1850 by Grover Canavan, RN Outcome: Adequate for Discharge 02/24/2021 1024 by Grover Canavan, RN Outcome: Progressing   Problem: Nutrition: Goal: Adequate nutrition will be maintained 02/24/2021 1850 by Grover Canavan, RN Outcome: Adequate for Discharge 02/24/2021 1024 by Grover Canavan, RN Outcome: Progressing   Problem: Coping: Goal: Level of anxiety will  decrease 02/24/2021 1850 by Grover Canavan, RN Outcome: Adequate for Discharge 02/24/2021 1024 by Grover Canavan, RN Outcome: Progressing   Problem: Elimination: Goal: Will not experience complications related to bowel motility 02/24/2021 1850 by Grover Canavan, RN Outcome: Adequate for Discharge 02/24/2021 1024 by Grover Canavan, RN Outcome: Progressing Goal: Will not experience complications related to urinary retention 02/24/2021 1850 by Grover Canavan, RN Outcome: Adequate for Discharge 02/24/2021 1024 by Grover Canavan, RN Outcome: Progressing   Problem: Pain Managment: Goal: General experience of comfort will improve 02/24/2021 1850 by Grover Canavan, RN Outcome: Adequate for Discharge 02/24/2021 1024 by Grover Canavan, RN Outcome: Progressing   Problem: Safety: Goal: Ability to remain free from injury will improve 02/24/2021 1850 by Grover Canavan, RN Outcome: Adequate for Discharge 02/24/2021 1024 by Grover Canavan, RN Outcome: Progressing   Problem: Skin Integrity: Goal: Risk for impaired skin integrity will decrease 02/24/2021 1850 by Grover Canavan, RN Outcome: Adequate for Discharge 02/24/2021 1024 by Grover Canavan, RN Outcome: Progressing

## 2021-02-24 NOTE — Discharge Summary (Signed)
Discharge Summary    Patient ID: Roger ARDOIN MRN: [id number]; DOB: 1956-03-12  Admit date: 02/23/2021 Discharge date: 02/24/2021  PCP:  Cleatis Polka., MD   [facility] Providers Cardiologist:  Armanda Magic, MD   {   Discharge Diagnoses    Principal Problem:   Unstable angina [facility]) Active Problems:   Type 2 diabetes mellitus (HCC)   Hypertension associated with diabetes (HCC)   Hyperlipidemia associated with type 2 diabetes mellitus (HCC)   Chest pain  Diagnostic Studies/Procedures    Cath: 02/24/21    Widely patent left main   Moderate LAD calcification with proximal to mid irregularities up to 50%.  Second diagonal 75 to 80% ostial.  Vessel is relatively small.   Widely patent circumflex   Widely patent dominant RCA with luminal irregularities noted   Normal LV function with LVEDP 18 mmHg.   Recommendation: Risk factor modification, medical therapy of hypertension, and consideration of alternative explanation for prolonged chest pain.  Microvascular dysfunction cannot be totally excluded as etiology.  Diagnostic Dominance: Right   Echo: 02/24/21  IMPRESSIONS     1. Left ventricular ejection fraction, by estimation, is 60 to 65%. The  left ventricle has normal function. The left ventricle has no regional  wall motion abnormalities. Left ventricular diastolic parameters were  normal.   2. Right ventricular systolic function is normal. The right ventricular  size is normal. There is normal pulmonary artery systolic pressure.   3. The mitral valve is normal in structure. Mild mitral valve  regurgitation. No evidence of mitral stenosis.   4. The aortic valve is normal in structure. Aortic valve regurgitation is  not visualized. No aortic stenosis is present.   5. The inferior vena cava is normal in size with <50% respiratory  variability, suggesting right atrial pressure of 8 mmHg.   FINDINGS   Left Ventricle: Left ventricular ejection fraction, by  estimation, is 60  to 65%. The left ventricle has normal function. The left ventricle has no  regional wall motion abnormalities. The left ventricular internal cavity  size was normal in size. There is   no left ventricular hypertrophy. Left ventricular diastolic parameters  were normal. Normal left ventricular filling pressure.   Right Ventricle: The right ventricular size is normal. No increase in  right ventricular wall thickness. Right ventricular systolic function is  normal. There is normal pulmonary artery systolic pressure. The tricuspid  regurgitant velocity is 1.80 m/s, and   with an assumed right atrial pressure of 8 mmHg, the estimated right  ventricular systolic pressure is 21.0 mmHg.   Left Atrium: Left atrial size was normal in size.   Right Atrium: Right atrial size was normal in size.   Pericardium: There is no evidence of pericardial effusion.   Mitral Valve: The mitral valve is normal in structure. Mild mitral valve  regurgitation. No evidence of mitral valve stenosis.   Tricuspid Valve: The tricuspid valve is normal in structure. Tricuspid  valve regurgitation is trivial. No evidence of tricuspid stenosis.   Aortic Valve: The aortic valve is normal in structure. Aortic valve  regurgitation is not visualized. No aortic stenosis is present.   Pulmonic Valve: The pulmonic valve was normal in structure. Pulmonic valve  regurgitation is mild. No evidence of pulmonic stenosis.   Aorta: The aortic root is normal in size and structure.   Venous: The inferior vena cava is normal in size with less than 50%  respiratory variability, suggesting right atrial pressure of 8  mmHg.   IAS/Shunts: No atrial level shunt detected by color flow Doppler.  _____________   History of Present Illness     Roger Fernandez is a 65 y.o. male with  a hx of HLD, DM2 and HTN who is referred for SOB and chest pain. He was seen earlier the day of admission for complaints of chest tightness  that started around 5pm the day prior after working in the yard all morning.  Symptoms started after he had showered and was sitting resting.  The pressure lasted about an hour and was accompanied by diaphoersis, SOB and fatigue.  There was no radiation of the pain.  He drank some cold water and used a cold compress and his sx resolved after an hour but felt weak for the rest of the evening.  He apparently had been having intermittent chest tightness over the past month.  His BP was 83/261mmHg while having CP. He was worked in as an urgent visit for evaluation with Dr. Mayford Knifeurner.  He reported he still felt SOB and continued to have CP about 3/10.  He said that it was a sharp pain poking at his heart as well as tightness but not as bad.  He occasionally smokes a cigar.   Given symptoms he was sent over for direct admission with plans for cath.   Hospital Course     Unstable angina: underwent cardiac cath noted above with moderate mLAD 50%, second diag 75-80% ostial lesion. Recommendations for medical therapy.  -- continue ASA 81mg  daily, atorvastatin to 20mg  daily, lopressor 12.5 mg BID added   HTN: resumed on zestroetic 20/12.5mg  daily, added lopressor 12.5mg  BID  HLD: LDL is at goal 34.  -- continue atorvastatin 20mg  daily   DM2: HbA1C 6.8% -- metformin resumed 48 hrs post cath  Tobacco use: occasionally smokes cigar, but now wants to quit  Radial cath site was assessed prior to discharge and noted to be stable. Instructions/precautions given. Questions answers. Follow up in the office arranged, and [facility].    Did the patient have an acute coronary syndrome (MI, NSTEMI, STEMI, etc) this admission?:  No                               Did the patient have a percutaneous coronary intervention (stent / angioplasty)?:  No.   _____________  Discharge Vitals Blood pressure 135/71, pulse 66, temperature 97.8 F (36.6 C), temperature source Oral, resp. rate  15, height 6\' 1"  (1.854 m), weight 89.8 kg, SpO2 97 %.  Filed Weights   02/23/21 1800 02/24/21 0404  Weight: 89.9 kg 89.8 kg    Labs & Radiologic Studies    CBC Recent Labs    02/23/21 1901 02/24/21 0259  WBC 10.3 9.4  NEUTROABS 5.5  --   HGB 14.5 14.1  HCT 42.2 41.1  MCV 86.8 88.2  PLT 247 243   Basic Metabolic Panel Recent Labs    16/03/9608/06/22 1901 02/24/21 0259  NA 135 137  K 4.0 3.4*  CL 101 104  CO2 23 26  GLUCOSE 85 117*  BUN 11 11  CREATININE 0.92 0.90  CALCIUM 9.6 9.3   Liver Function Tests Recent Labs    02/23/21 1901  AST 20  ALT 24  ALKPHOS 37*  BILITOT 0.7  PROT 6.0*  ALBUMIN 3.7   No results for input(s): LIPASE, AMYLASE in the last 72 hours. High  Sensitivity Troponin:   Recent Labs  Lab 02/23/21 1901 02/23/21 2024  TROPONINIHS 5 5    BNP Invalid input(s): POCBNP D-Dimer No results for input(s): DDIMER in the last 72 hours. Hemoglobin A1C Recent Labs    02/23/21 1901  HGBA1C 6.8*   Fasting Lipid Panel Recent Labs    02/24/21 0259  CHOL 99  HDL 33*  LDLCALC 34  TRIG 811*  CHOLHDL 3.0   Thyroid Function Tests Recent Labs    02/23/21 1901  TSH 2.853   _____________  CARDIAC CATHETERIZATION  Result Date: 02/24/2021   Widely patent left main   Moderate LAD calcification with proximal to mid irregularities up to 50%.  Second diagonal 75 to 80% ostial.  Vessel is relatively small.   Widely patent circumflex   Widely patent dominant RCA with luminal irregularities noted   Normal LV function with LVEDP 18 mmHg. Recommendation: Risk factor modification, medical therapy of hypertension, and consideration of alternative explanation for prolonged chest pain.  Microvascular dysfunction cannot be totally excluded as etiology.   DG CHEST PORT 1 VIEW  Result Date: 02/23/2021 CLINICAL DATA:  Chest pain EXAM: PORTABLE CHEST 1 VIEW COMPARISON:  12/27/2019 FINDINGS: The heart size and mediastinal contours are within normal limits. Both lungs are  clear. Hardware in the cervical spine. IMPRESSION: No active disease. Electronically Signed   By: Jasmine Pang M.D.   On: 02/23/2021 20:48   ECHOCARDIOGRAM COMPLETE  Result Date: 02/24/2021    ECHOCARDIOGRAM REPORT   Patient Name:   Roger Fernandez Date of Exam: 02/24/2021 Medical Rec #:  [id number]      Height:       73.0 in Accession #:    [id number]     Weight:       198.0 lb Date of Birth:  1956-02-22      BSA:          2.142 m Patient Age:    64 years       BP:           121/75 mmHg Patient Gender: M              HR:           70 bpm. Exam Location:  Inpatient Procedure: 2D Echo, Cardiac Doppler and Color Doppler Indications:    R07.9* Chest pain, unspecified  History:        Patient has no prior history of Echocardiogram examinations.                 Risk Factors:Dyslipidemia, Diabetes and Hypertension.  Sonographer:    Eulah Pont RDCS Referring Phys: [id number] DAYNA N DUNN IMPRESSIONS  1. Left ventricular ejection fraction, by estimation, is 60 to 65%. The left ventricle has normal function. The left ventricle has no regional wall motion abnormalities. Left ventricular diastolic parameters were normal.  2. Right ventricular systolic function is normal. The right ventricular size is normal. There is normal pulmonary artery systolic pressure.  3. The mitral valve is normal in structure. Mild mitral valve regurgitation. No evidence of mitral stenosis.  4. The aortic valve is normal in structure. Aortic valve regurgitation is not visualized. No aortic stenosis is present.  5. The inferior vena cava is normal in size with <50% respiratory variability, suggesting right atrial pressure of 8 mmHg. FINDINGS  Left Ventricle: Left ventricular ejection fraction, by estimation, is 60 to 65%. The left ventricle has normal function. The left ventricle has no regional wall motion abnormalities. The left  ventricular internal cavity size was normal in size. There is  no left ventricular hypertrophy. Left ventricular diastolic  parameters were normal. Normal left ventricular filling pressure. Right Ventricle: The right ventricular size is normal. No increase in right ventricular wall thickness. Right ventricular systolic function is normal. There is normal pulmonary artery systolic pressure. The tricuspid regurgitant velocity is 1.80 m/s, and  with an assumed right atrial pressure of 8 mmHg, the estimated right ventricular systolic pressure is 21.0 mmHg. Left Atrium: Left atrial size was normal in size. Right Atrium: Right atrial size was normal in size. Pericardium: There is no evidence of pericardial effusion. Mitral Valve: The mitral valve is normal in structure. Mild mitral valve regurgitation. No evidence of mitral valve stenosis. Tricuspid Valve: The tricuspid valve is normal in structure. Tricuspid valve regurgitation is trivial. No evidence of tricuspid stenosis. Aortic Valve: The aortic valve is normal in structure. Aortic valve regurgitation is not visualized. No aortic stenosis is present. Pulmonic Valve: The pulmonic valve was normal in structure. Pulmonic valve regurgitation is mild. No evidence of pulmonic stenosis. Aorta: The aortic root is normal in size and structure. Venous: The inferior vena cava is normal in size with less than 50% respiratory variability, suggesting right atrial pressure of 8 mmHg. IAS/Shunts: No atrial level shunt detected by color flow Doppler.  LEFT VENTRICLE PLAX 2D LVIDd:         4.60 cm  Diastology LVIDs:         3.00 cm  LV e' medial:    7.43 cm/s LV PW:         0.90 cm  LV E/e' medial:  7.2 LV IVS:        1.00 cm  LV e' lateral:   9.60 cm/s LVOT diam:     2.10 cm  LV E/e' lateral: 5.6 LV SV:         61 LV SV Index:   28 LVOT Area:     3.46 cm  RIGHT VENTRICLE RV S prime:     12.20 cm/s TAPSE (M-mode): 1.8 cm LEFT ATRIUM             Index       RIGHT ATRIUM           Index LA diam:        3.30 cm 1.54 cm/m  RA Area:     12.50 cm LA Vol (A2C):   33.8 ml 15.78 ml/m RA Volume:   27.30 ml  12.74  ml/m LA Vol (A4C):   33.4 ml 15.59 ml/m LA Biplane Vol: 33.7 ml 15.73 ml/m  AORTIC VALVE LVOT Vmax:   86.40 cm/s LVOT Vmean:  56.600 cm/s LVOT VTI:    0.176 m  AORTA Ao Root diam: 3.20 cm Ao Asc diam:  3.30 cm MITRAL VALVE               TRICUSPID VALVE MV Area (PHT): 2.91 cm    TR Peak grad:   13.0 mmHg MV Decel Time: 261 msec    TR Vmax:        180.00 cm/s MV E velocity: 53.30 cm/s MV A velocity: 53.30 cm/s  SHUNTS MV E/A ratio:  1.00        Systemic VTI:  0.18 m                            Systemic Diam: 2.10 cm Armanda Magic MD Electronically signed by Gloris Manchester  Turner MD Signature Date/Time: 02/24/2021/9:34:46 AM    Final    Disposition   Pt is being discharged home today in good condition.  Follow-up Plans & Appointments    Follow-up Information     Quintella Reichert, MD Follow up on 03/16/2021.   Specialty: Cardiology Why: at 9:30am for your follow up appt Contact information: [address] [place] [address] [phone]                Discharge Instructions     Call MD for:  difficulty breathing, headache or visual disturbances   Complete by: As directed    Call MD for:  extreme fatigue   Complete by: As directed    Call MD for:  hives   Complete by: As directed    Call MD for:  persistant dizziness or light-headedness   Complete by: As directed    Call MD for:  persistant nausea and vomiting   Complete by: As directed    Call MD for:  redness, tenderness, or signs of infection (pain, swelling, redness, odor or green/yellow discharge around incision site)   Complete by: As directed    Call MD for:  severe uncontrolled pain   Complete by: As directed    Call MD for:  temperature >100.4   Complete by: As directed    Diet - low sodium heart healthy   Complete by: As directed    Discharge instructions   Complete by: As directed    No driving for 3-5 days. No lifting over 5 lbs for 1 week. No sexual activity for 1 week. Keep procedure site clean & dry. If  you notice increased pain, swelling, bleeding or pus, call/return!  You may shower, but no soaking baths/hot tubs/pools for 1 week.   Increase activity slowly   Complete by: As directed       Discharge Medications   Allergies as of 02/24/2021   No Known Allergies      Medication List     STOP taking these medications    oxyCODONE 5 MG immediate release tablet Commonly known as: Roxicodone       TAKE these medications    aspirin EC 81 MG tablet Take 81 mg by mouth daily. Swallow whole.   atorvastatin 20 MG tablet Commonly known as: LIPITOR Take 20 mg by mouth daily.   [place] 450 MG Caps Take 450 mg by mouth daily.   lisinopril-hydrochlorothiazide 20-12.5 MG tablet Commonly known as: ZESTORETIC Take 1 tablet by mouth daily.   metFORMIN 500 MG tablet Commonly known as: GLUCOPHAGE Take 500 mg by mouth 2 (two) times daily. Notes to patient: Please do not resume until 9/9   metoprolol tartrate 25 MG tablet Commonly known as: LOPRESSOR Take 1/2 tablet (12.5 mg total) by mouth 2 (two) times daily.   nitroGLYCERIN 0.4 MG SL tablet Commonly known as: NITROSTAT Place 1 tablet (0.4 mg total) under the tongue every 5 (five) minutes x 3 doses as needed for chest pain.   PRESCRIPTION MEDICATION Apply 1 application topically daily. Testosterone compounded cream.   PREVAGEN PO Take 1 capsule by mouth daily.   Valerian 500 MG Caps Take 500 mg by mouth daily as needed (calming.).         Outstanding Labs/Studies   N/a   Duration of Discharge Encounter   Greater than 30 minutes including physician time.  Signed, Georgie Chard, NP 02/24/2021, 6:45 PM

## 2021-02-24 NOTE — Progress Notes (Signed)
10cc's of air was removed from patient's TR Band.  The site looked good with no oozing or hematoma noted.

## 2021-03-03 ENCOUNTER — Telehealth: Payer: Self-pay | Admitting: Cardiology

## 2021-03-03 MED ORDER — ISOSORBIDE MONONITRATE ER 30 MG PO TB24: 15.0000 mg | ORAL_TABLET | Freq: Every day | ORAL | 3 refills | Status: DC

## 2021-03-03 NOTE — Telephone Encounter (Signed)
Pt c/o medication issue:  1. Name of Medication: Metoprolol   2. How are you currently taking this medication (dosage and times per day)?  2 times a day  3. Are you having a reaction (difficulty breathing--STAT)?  Short of breath when he takes it   4. What is your medication issue? Confusion, stumbling, dizziness and very tired and sleepy

## 2021-03-03 NOTE — Telephone Encounter (Signed)
Spoke with the patient who states that ever since he was started on metoprolol he has been very fatigued. He reports lack of energy, dizziness, and some shortness of breath at times. He states that blood pressure and heart rate have been good. Running around 120's/60-70s. Heart rate has been in the 70s. He is staying hydrated. Does not feel like he is going to pass out.

## 2021-03-03 NOTE — Telephone Encounter (Signed)
Spoke with the patient. He will stop Lopressor and start Imdur 15 mg daily. He is already scheduled for a follow up with Dr. Mayford Knife 9/27.

## 2021-03-08 ENCOUNTER — Telehealth: Payer: Self-pay | Admitting: Cardiology

## 2021-03-08 NOTE — Telephone Encounter (Signed)
Pt c/o medication issue:  1. Name of Medication: isosorbide mononitrate (IMDUR) 30 MG 24 hr tablet  2. How are you currently taking this medication (dosage and times per day)? Patient has not taken since Saturday  3. Are you having a reaction (difficulty breathing--STAT)? Severe Headaches  4. What is your medication issue? Patient would like to be on a different medication

## 2021-03-08 NOTE — Telephone Encounter (Signed)
Patient reports headaches since starting Imdur last 9/15. He only took it for three days and could not tolerate due to headaches that lasted all throughout the day.

## 2021-03-09 MED ORDER — METOPROLOL SUCCINATE ER 25 MG PO TB24: 25.0000 mg | ORAL_TABLET | Freq: Every day | ORAL | 3 refills | Status: DC

## 2021-03-09 NOTE — Telephone Encounter (Signed)
Patient is not taking Lopressor.  States he has been feeling pretty good.  Blood pressure has been 130s/70-80s. He did go for a two mile walk this morning and reports some mild discomfort afterwards.

## 2021-03-09 NOTE — Telephone Encounter (Signed)
Spoke with the patient who will start on Toprol XL 25 mg daily. He will bring a list of his BP and HR readings to his appointment next week.

## 2021-03-16 ENCOUNTER — Encounter: Payer: Self-pay | Admitting: Cardiology

## 2021-03-16 ENCOUNTER — Other Ambulatory Visit: Payer: Self-pay

## 2021-03-16 ENCOUNTER — Ambulatory Visit (INDEPENDENT_AMBULATORY_CARE_PROVIDER_SITE_OTHER): Payer: Medicare Other | Admitting: Cardiology

## 2021-03-16 VITALS — BP 118/68 | HR 72 | Ht 73.0 in | Wt 198.8 lb

## 2021-03-16 DIAGNOSIS — E119 Type 2 diabetes mellitus without complications: Secondary | ICD-10-CM

## 2021-03-16 DIAGNOSIS — I2583 Coronary atherosclerosis due to lipid rich plaque: Secondary | ICD-10-CM | POA: Diagnosis not present

## 2021-03-16 DIAGNOSIS — E7800 Pure hypercholesterolemia, unspecified: Secondary | ICD-10-CM | POA: Diagnosis not present

## 2021-03-16 DIAGNOSIS — E1159 Type 2 diabetes mellitus with other circulatory complications: Secondary | ICD-10-CM

## 2021-03-16 DIAGNOSIS — I152 Hypertension secondary to endocrine disorders: Secondary | ICD-10-CM

## 2021-03-16 DIAGNOSIS — I2510 Atherosclerotic heart disease of native coronary artery without angina pectoris: Secondary | ICD-10-CM | POA: Insufficient documentation

## 2021-03-16 MED ORDER — ATORVASTATIN CALCIUM 40 MG PO TABS: 40.0000 mg | ORAL_TABLET | Freq: Every day | ORAL | 3 refills | Status: DC

## 2021-03-16 NOTE — Addendum Note (Signed)
Addended by: Theresia Majors on: 03/16/2021 09:56 AM   Modules accepted: Orders

## 2021-03-16 NOTE — Patient Instructions (Signed)
Medication Instructions:  Your physician has recommended you make the following change in your medication: 1) INCREASE Lipitor (atorvastatin) to 40 mg daily  *If you need a refill on your cardiac medications before your next appointment, please call your pharmacy*   Lab Work: Fasting lipid and ALT in 6 weeks If you have labs (blood work) drawn today and your tests are completely normal, you will receive your results only by: MyChart Message (if you have MyChart) OR A paper copy in the mail If you have any lab test that is abnormal or we need to change your treatment, we will call you to review the results.   Follow-Up: At [facility], you and your health needs are our priority.  As part of our continuing mission to provide you with exceptional heart care, we have created designated Provider Care Teams.  These Care Teams include your primary Cardiologist (physician) and Advanced Practice Providers (APPs -  Physician Assistants and Nurse Practitioners) who all work together to provide you with the care you need, when you need it.  Your next appointment:   6 month(s)  The format for your next appointment:   In Person  Provider:   You may see Armanda Magic, MD or one of the following Advanced Practice Providers on your designated Care Team:   Ronie Spies, PA-C Jacolyn Reedy, PA-C

## 2021-03-16 NOTE — Progress Notes (Signed)
Cardiology H&P  Date:  03/16/2021   ID:  Raghav, Verrilli 1956-02-23, MRN [id number]  PCP:  Cleatis Polka., MD  Cardiologist:  Armanda Magic, MD   Chief Complaint  Patient presents with   Coronary Artery Disease   Hypertension   Hyperlipidemia     History of Present Illness:  Roger Fernandez is a 65 y.o. male  with a hx of HLD, DM2 and HTN who was initially referred for SOB and chest pain. He ultimately underwent cath 02/24/2021 which showed patent LM, moderately calcified LAD proximally up to 50% and 75-80% D2, patent LCx and RCA and LVEDP and medical therapy was recommended.  There was some thought that he could have microvascular angina.    He is here today for followup and is doing well.  He has not had any significant chest discomfort since his cath and has been walking 2 miles daily.  He denies any SOB, DOE, PND, orthopnea, LE edema, dizziness, palpitations or syncope. He is compliant with his meds and is tolerating meds with no SE.      Past Medical History:  Diagnosis Date   CAD (coronary artery disease), native coronary artery    cath 02/24/2021 which showed patent LM, moderately calcified LAD proximally up to 50% and 75-80% D2, patent LCx and RCA and LVEDP and medical therapy was recommended.  There was some thought that he could have microvascular angina.   Hyperlipidemia associated with type 2 diabetes mellitus (HCC)    Hypertension    DR Martha Clan PCP  DR [facility] CARDIAC   Type 2 diabetes mellitus [facility])     Past Surgical History:  Procedure Laterality Date   ANTERIOR CERVICAL DECOMP/DISCECTOMY FUSION  06/06/2011   Procedure: ANTERIOR CERVICAL DECOMPRESSION/DISCECTOMY FUSION 3 LEVELS;  Surgeon: Hewitt Shorts;  Location: [facility] NEURO ORS;  Service: Neurosurgery;  Laterality: N/A;  Cervical Three-four,Cervical Four-Five,Cervical Five-Six anterior cervical decompression with fusion plating and bonegraft   APPENDECTOMY     LEFT HEART CATH AND  CORONARY ANGIOGRAPHY N/A 02/24/2021   Procedure: LEFT HEART CATH AND CORONARY ANGIOGRAPHY;  Surgeon: Lyn Records, MD;  Location: [facility] INVASIVE CV LAB;  Service: Cardiovascular;  Laterality: N/A;   TONSILLECTOMY      Current Medications: Current Meds  Medication Sig   Apoaequorin (PREVAGEN PO) Take 1 capsule by mouth daily.   aspirin EC 81 MG tablet Take 81 mg by mouth daily. Swallow whole.   atorvastatin (LIPITOR) 20 MG tablet Take 20 mg by mouth daily.   Cascara Sagrada 450 MG CAPS Take 450 mg by mouth daily.   lisinopril-hydrochlorothiazide (PRINZIDE,ZESTORETIC) 20-12.5 MG per tablet Take 1 tablet by mouth daily.    metFORMIN (GLUCOPHAGE) 500 MG tablet Take 500 mg by mouth 2 (two) times daily.   metoprolol succinate (TOPROL XL) 25 MG 24 hr tablet Take 1 tablet (25 mg total) by mouth daily.   nitroGLYCERIN (NITROSTAT) 0.4 MG SL tablet Place 1 tablet (0.4 mg total) under the tongue every 5 (five) minutes x 3 doses as needed for chest pain.   PRESCRIPTION MEDICATION Apply 1 application topically daily. Testosterone compounded cream.   Valerian 500 MG CAPS Take 500 mg by mouth daily as needed (calming.).    Allergies:   Patient has no known allergies.   Social History   Socioeconomic History   Marital status: Married    Spouse name: Not on file   Number of children: Not on file  Years of education: Not on file   Highest education level: Not on file  Occupational History   Not on file  Tobacco Use   Smoking status: Some Days    Types: Cigars   Smokeless tobacco: Never  Substance and Sexual Activity   Alcohol use: Yes    Comment: OCC.   Drug use: No   Sexual activity: Not on file  Other Topics Concern   Not on file  Social History Narrative   Not on file   Social Determinants of Health   Financial Resource Strain: Not on file  Food Insecurity: Not on file  Transportation Needs: Not on file  Physical Activity: Not on file  Stress: Not on file  Social Connections: Not  on file     Family History:  The patient's family history includes Alcoholism in his brother; Bladder Cancer in his father; Coronary artery disease (age of onset: 14) in his mother; Kidney failure in his mother.   ROS:   Please see the history of present illness.    ROS All other systems reviewed and are negative.  No flowsheet data found.     PHYSICAL EXAM:   VS:  BP 118/68   Pulse 72   Ht 6\' 1"  (1.854 m)   Wt 198 lb 12.8 oz (90.2 kg)   SpO2 97%   BMI 26.23 kg/m     Wt Readings from Last 3 Encounters:  03/16/21 198 lb 12.8 oz (90.2 kg)  02/24/21 197 lb 15.6 oz (89.8 kg)  02/23/21 202 lb 6.4 oz (91.8 kg)     GEN: Well nourished, well developed in no acute distress HEENT: Normal NECK: No JVD; No carotid bruits LYMPHATICS: No lymphadenopathy CARDIAC:RRR, no murmurs, rubs, gallops RESPIRATORY:  Clear to auscultation without rales, wheezing or rhonchi  ABDOMEN: Soft, non-tender, non-distended MUSCULOSKELETAL:  No edema; No deformity  SKIN: Warm and dry NEUROLOGIC:  Alert and oriented x 3 PSYCHIATRIC:  Normal affect   Studies/Labs Reviewed:   EKG:  EKG is not ordered today.    Recent Labs: 02/23/2021: ALT 24; TSH 2.853 02/24/2021: BUN 11; Creatinine, Ser 0.90; Hemoglobin 14.1; Platelets 243; Potassium 3.4; Sodium 137   Lipid Panel    Component Value Date/Time   CHOL 99 02/24/2021 0259   TRIG 159 (H) 02/24/2021 0259   HDL 33 (L) 02/24/2021 0259   CHOLHDL 3.0 02/24/2021 0259   VLDL 32 02/24/2021 0259   LDLCALC 34 02/24/2021 0259   Additional studies/ records that were reviewed today include:  OV notes from PCP    ASSESSMENT:    1. Coronary artery disease due to lipid rich plaque   2. Hypertension associated with diabetes (HCC)   3. Pure hypercholesterolemia   4. DM type 2, goal HbA1c < 7% (HCC)     PLAN:  In order of problems listed above:  ASCAD -cath 02/24/2021 showed patent LM, moderately calcified LAD proximally up to 50% and 75-80% D2, patent LCx and  RCA and LVEDP 04/26/2021 and medical therapy was recommended.  There was some thought that he could have microvascular angina. -he denies any anginal sx on BB -continue ASA, statin and BB  2.  HTN -BP is adequately controlled on exam today -Continue prescription drug management with Lisinopril-HCT 20-12.5mg  daily and Toprol XL 25mg  daily   3.  HLD -LDL goal < 70  -recent lipids showed LDL 60, TAG 60 and HDL 35 -he was supposed to increase atorvastatin to 40mg  daily but is still on 20mg  daily so  will increase to 40mg  daily and repeat FLP and ALT in 6 weeks  4.  DM2 -I have personally reviewed and interpreted outside labs performed by patient's PCP which showed  HbA1C 6.8% -continue prescription drug management with metformin 500mg  BID -followed by PCP   Time Spent: 30 minutes total time of encounter, including 20 minutes spent in face-to-face patient care on the date of this encounter. This time includes coordination of care and counseling regarding above mentioned problem list. Remainder of non-face-to-face time involved reviewing chart documents/testing relevant to the patient encounter and documentation in the medical record. I have independently reviewed documentation from referring provider  Medication Adjustments/Labs and Tests Ordered: Current medicines are reviewed at length with the patient today.  Concerns regarding medicines are outlined above.  Medication changes, Labs and Tests ordered today are listed in the Patient Instructions below.  There are no Patient Instructions on file for this visit.   Signed, , MD  03/16/2021 9:44 AM    [facility] [address] [place], [place], [place]  [place] Phone: [phone]; Fax: [phone]

## 2021-04-28 ENCOUNTER — Other Ambulatory Visit: Payer: Self-pay

## 2021-04-28 ENCOUNTER — Other Ambulatory Visit: Payer: Medicare Other

## 2021-04-28 DIAGNOSIS — E7800 Pure hypercholesterolemia, unspecified: Secondary | ICD-10-CM

## 2021-04-28 DIAGNOSIS — E119 Type 2 diabetes mellitus without complications: Secondary | ICD-10-CM

## 2021-04-28 DIAGNOSIS — I2510 Atherosclerotic heart disease of native coronary artery without angina pectoris: Secondary | ICD-10-CM

## 2021-04-28 DIAGNOSIS — E1159 Type 2 diabetes mellitus with other circulatory complications: Secondary | ICD-10-CM

## 2021-04-28 DIAGNOSIS — I152 Hypertension secondary to endocrine disorders: Secondary | ICD-10-CM

## 2021-04-28 DIAGNOSIS — I2583 Coronary atherosclerosis due to lipid rich plaque: Secondary | ICD-10-CM

## 2021-04-28 LAB — LIPID PANEL
Chol/HDL Ratio: 2.5 ratio (ref 0.0–5.0)
Cholesterol, Total: 89 mg/dL — ABNORMAL LOW (ref 100–199)
HDL: 36 mg/dL — ABNORMAL LOW (ref >39)
LDL Chol Calc (NIH): 36 mg/dL (ref 0–99)
Triglycerides: 81 mg/dL (ref 0–149)
VLDL Cholesterol Cal: 17 mg/dL (ref 5–40)

## 2021-04-28 LAB — ALT: ALT: 23 IU/L (ref 0–44)

## 2021-09-14 ENCOUNTER — Encounter: Payer: Self-pay | Admitting: Cardiology

## 2021-09-14 ENCOUNTER — Other Ambulatory Visit: Payer: Self-pay

## 2021-09-14 ENCOUNTER — Ambulatory Visit (INDEPENDENT_AMBULATORY_CARE_PROVIDER_SITE_OTHER): Payer: Medicare Other | Admitting: Cardiology

## 2021-09-14 VITALS — BP 124/78 | HR 70 | Ht 73.0 in | Wt 202.0 lb

## 2021-09-14 DIAGNOSIS — E1169 Type 2 diabetes mellitus with other specified complication: Secondary | ICD-10-CM

## 2021-09-14 DIAGNOSIS — I2510 Atherosclerotic heart disease of native coronary artery without angina pectoris: Secondary | ICD-10-CM

## 2021-09-14 DIAGNOSIS — E1159 Type 2 diabetes mellitus with other circulatory complications: Secondary | ICD-10-CM | POA: Diagnosis not present

## 2021-09-14 DIAGNOSIS — I152 Hypertension secondary to endocrine disorders: Secondary | ICD-10-CM

## 2021-09-14 DIAGNOSIS — E119 Type 2 diabetes mellitus without complications: Secondary | ICD-10-CM

## 2021-09-14 DIAGNOSIS — E785 Hyperlipidemia, unspecified: Secondary | ICD-10-CM

## 2021-09-14 MED ORDER — METOPROLOL SUCCINATE ER 25 MG PO TB24: 25.0000 mg | ORAL_TABLET | Freq: Every day | ORAL | 3 refills | Status: DC

## 2021-09-14 MED ORDER — ATORVASTATIN CALCIUM 40 MG PO TABS: 40.0000 mg | ORAL_TABLET | Freq: Every day | ORAL | 3 refills | Status: DC

## 2021-09-14 NOTE — Patient Instructions (Signed)

## 2021-09-14 NOTE — Addendum Note (Signed)
Addended by: Theresia Majors on: 09/14/2021 09:23 AM ? ? Modules accepted: Orders ? ?

## 2021-09-14 NOTE — Progress Notes (Signed)
?  ? ? ?Cardiology H&P ? ?Date:  09/14/2021  ? ?ID:  [name], DOB 15-Jan-1956, MRN [id number] ? ?PCP:  Cleatis Polka., MD  ?Cardiologist:  Armanda Magic, MD  ? ?Chief Complaint  ?Patient presents with  ? Coronary Artery Disease  ? Hypertension  ? Hyperlipidemia  ? ? ? ?History of Present Illness:  ?Roger Fernandez is a 66 y.o. male  with a hx of HLD, DM2 and HTN who was initially referred for SOB and chest pain. He ultimately underwent cath 02/24/2021 which showed patent LM, moderately calcified LAD proximally up to 50% and 75-80% D2, patent LCx and RCA and LVEDP and medical therapy was recommended.  There was some thought that he could have microvascular angina.   ? ?He is here today for followup and is doing well.  He denies any anginal chest pain or pressure, SOB, DOE, PND, orthopnea, LE edema, dizziness, palpitations or syncope. He is compliant with his meds and is tolerating meds with no SE.    ? ?Past Medical History:  ?Diagnosis Date  ? CAD (coronary artery disease), native coronary artery   ? cath 02/24/2021 which showed patent LM, moderately calcified LAD proximally up to 50% and 75-80% D2, patent LCx and RCA and LVEDP and medical therapy was recommended.  There was some thought that he could have microvascular angina.  ? Hyperlipidemia associated with type 2 diabetes mellitus (HCC)   ? Hypertension   ? DR Martha Clan PCP  DR Mendel Ryder CARDIAC  ? Type 2 diabetes mellitus (HCC)   ? ? ?Past Surgical History:  ?Procedure Laterality Date  ? ANTERIOR CERVICAL DECOMP/DISCECTOMY FUSION  06/06/2011  ? Procedure: ANTERIOR CERVICAL DECOMPRESSION/DISCECTOMY FUSION 3 LEVELS;  Surgeon: Hewitt Shorts;  Location: [facility] NEURO ORS;  Service: Neurosurgery;  Laterality: N/A;  Cervical Three-four,Cervical Four-Five,Cervical Five-Six anterior cervical decompression with fusion plating and bonegraft  ? APPENDECTOMY    ? LEFT HEART CATH AND CORONARY ANGIOGRAPHY N/A 02/24/2021  ? Procedure: LEFT HEART CATH AND  CORONARY ANGIOGRAPHY;  Surgeon: Lyn Records, MD;  Location: [facility] INVASIVE CV LAB;  Service: Cardiovascular;  Laterality: N/A;  ? TONSILLECTOMY    ? ? ?Current Medications: ?Current Meds  ?Medication Sig  ? Apoaequorin (PREVAGEN PO) Take 1 capsule by mouth daily.  ? aspirin EC 81 MG tablet Take 81 mg by mouth daily. Swallow whole.  ? atorvastatin (LIPITOR) 40 MG tablet Take 1 tablet (40 mg total) by mouth daily.  ? Cascara Sagrada 450 MG CAPS Take 450 mg by mouth daily.  ? lisinopril-hydrochlorothiazide (PRINZIDE,ZESTORETIC) 20-12.5 MG per tablet Take 1 tablet by mouth daily.   ? metFORMIN (GLUCOPHAGE) 500 MG tablet Take 500 mg by mouth 2 (two) times daily.  ? metoprolol succinate (TOPROL XL) 25 MG 24 hr tablet Take 1 tablet (25 mg total) by mouth daily.  ? nitroGLYCERIN (NITROSTAT) 0.4 MG SL tablet Place 1 tablet (0.4 mg total) under the tongue every 5 (five) minutes x 3 doses as needed for chest pain.  ? PRESCRIPTION MEDICATION Apply 1 application topically daily. Testosterone compounded cream.  ? Valerian 500 MG CAPS Take 500 mg by mouth daily as needed (calming.).  ? ? ?Allergies:   Patient has no known allergies.  ? ?Social History  ? ?Socioeconomic History  ? Marital status: Married  ?  Spouse name: Not on file  ? Number of children: Not on file  ? Years of education: Not on file  ? Highest education level:  Not on file  ?Occupational History  ? Not on file  ?Tobacco Use  ? Smoking status: Some Days  ?  Types: Cigars  ? Smokeless tobacco: Never  ?Substance and Sexual Activity  ? Alcohol use: Yes  ?  Comment: OCC.  ? Drug use: No  ? Sexual activity: Not on file  ?Other Topics Concern  ? Not on file  ?Social History Narrative  ? Not on file  ? ?Social Determinants of Health  ? ?Financial Resource Strain: Not on file  ?Food Insecurity: Not on file  ?Transportation Needs: Not on file  ?Physical Activity: Not on file  ?Stress: Not on file  ?Social Connections: Not on file  ?  ? ?Family History:  The patient's family  history includes Alcoholism in his brother; Bladder Cancer in his father; Coronary artery disease (age of onset: 3954) in his mother; Kidney failure in his mother.  ? ?ROS:   ?Please see the history of present illness.    ?ROS All other systems reviewed and are negative. ? ?   ? View : No data to display.  ?  ?  ?  ? ? ? ? ? ?PHYSICAL EXAM:   ?VS:  BP 124/78   Pulse 70   Ht 6\' 1"  (1.854 m)   Wt 202 lb (91.6 kg)   SpO2 98%   BMI 26.65 kg/m?    ? ?Wt Readings from Last 3 Encounters:  ?09/14/21 202 lb (91.6 kg)  ?03/16/21 198 lb 12.8 oz (90.2 kg)  ?02/24/21 197 lb 15.6 oz (89.8 kg)  ?  ? ?GEN: Well nourished, well developed in no acute distress ?HEENT: Normal ?NECK: No JVD; No carotid bruits ?LYMPHATICS: No lymphadenopathy ?CARDIAC:RRR, no murmurs, rubs, gallops ?RESPIRATORY:  Clear to auscultation without rales, wheezing or rhonchi  ?ABDOMEN: Soft, non-tender, non-distended ?MUSCULOSKELETAL:  No edema; No deformity  ?SKIN: Warm and dry ?NEUROLOGIC:  Alert and oriented x 3 ?PSYCHIATRIC:  Normal affect   ?Studies/Labs Reviewed:  ? ?EKG:  EKG is not ordered today  ? ?Recent Labs: ?02/23/2021: TSH 2.853 ?02/24/2021: BUN 11; Creatinine, Ser 0.90; Hemoglobin 14.1; Platelets 243; Potassium 3.4; Sodium 137 ?04/28/2021: ALT 23  ? ?Lipid Panel ?   ?Component Value Date/Time  ? CHOL 89 (L) 04/28/2021 0955  ? TRIG 81 04/28/2021 0955  ? HDL 36 (L) 04/28/2021 0955  ? CHOLHDL 2.5 04/28/2021 0955  ? CHOLHDL 3.0 02/24/2021 0259  ? VLDL 32 02/24/2021 0259  ? LDLCALC 36 04/28/2021 0955  ? ?Additional studies/ records that were reviewed today include:  ?OV notes from PCP ? ? ? ?ASSESSMENT:   ? ?1. Coronary artery disease involving native coronary artery of native heart without angina pectoris   ?2. Hypertension associated with diabetes (HCC)   ?3. Hyperlipidemia associated with type 2 diabetes mellitus (HCC)   ?4. DM type 2, goal HbA1c < 7% (HCC)   ? ? ?PLAN:  ?In order of problems listed above: ? ?ASCAD ?-cath 02/24/2021 showed patent LM,  moderately calcified LAD proximally up to 50% and 75-80% D2, patent LCx and RCA and LVEDP 18mmHg and medical therapy was recommended.  There was some thought that he could have microvascular angina. ?-He has not had any anginal symptoms since I saw him last ?-Continue prescription drug management with aspirin 81 mg daily, Toprol-XL 25 mg daily and atorvastatin 40 mg daily with as needed refills ? ?2.  HTN ?-BP is well controlled on exam today ?-Continue prescription drug management with lisinopril HCT 20-12.5 mg daily and  Toprol-XL 25 mg daily with as needed refills ?-I have personally reviewed and interpreted outside labs performed by patient's PCP which showed serum creatinine 0.9 potassium 3.4 on 02/24/2021 ? ?3.  HLD ?-LDL goal < 70  ?-I have personally reviewed and interpreted outside labs performed by patient's PCP which showed LDL 36, HDL 36, triglycerides 81 and ALT 23 on 04/28/2021 ?-Continue prescription drug management with atorvastatin 40 mg daily with as needed refills ? ?4.  DM2 ?-I have personally reviewed and interpreted outside labs performed by patient's PCP which showed  HbA1C 6.5% on 07/02/2021 ?-continue prescription drug management with metformin 500mg  BID ?-followed by PCP ? ?Medication Adjustments/Labs and Tests Ordered: ?Current medicines are reviewed at length with the patient today.  Concerns regarding medicines are outlined above.  Medication changes, Labs and Tests ordered today are listed in the Patient Instructions below. ? ?There are no Patient Instructions on file for this visit. ? ? ?Signed, ? , MD  ?09/14/2021 9:19 AM    ?[facility] ?[address] [place], [place], [place]  [place] ?Phone: [phone]; Fax: [phone]  ? ? ?

## 2021-11-25 DIAGNOSIS — I10 Essential (primary) hypertension: Secondary | ICD-10-CM | POA: Diagnosis not present

## 2021-11-25 DIAGNOSIS — E291 Testicular hypofunction: Secondary | ICD-10-CM | POA: Diagnosis not present

## 2022-06-01 DIAGNOSIS — F17290 Nicotine dependence, other tobacco product, uncomplicated: Secondary | ICD-10-CM | POA: Diagnosis not present

## 2022-06-01 DIAGNOSIS — Z86010 Personal history of colonic polyps: Secondary | ICD-10-CM | POA: Diagnosis not present

## 2022-06-01 DIAGNOSIS — G3184 Mild cognitive impairment, so stated: Secondary | ICD-10-CM | POA: Diagnosis not present

## 2022-06-01 DIAGNOSIS — E119 Type 2 diabetes mellitus without complications: Secondary | ICD-10-CM | POA: Diagnosis not present

## 2022-06-01 DIAGNOSIS — Z23 Encounter for immunization: Secondary | ICD-10-CM | POA: Diagnosis not present

## 2022-06-01 DIAGNOSIS — G4700 Insomnia, unspecified: Secondary | ICD-10-CM | POA: Diagnosis not present

## 2022-06-01 DIAGNOSIS — I2510 Atherosclerotic heart disease of native coronary artery without angina pectoris: Secondary | ICD-10-CM | POA: Diagnosis not present

## 2022-06-01 DIAGNOSIS — H9313 Tinnitus, bilateral: Secondary | ICD-10-CM | POA: Diagnosis not present

## 2022-06-01 DIAGNOSIS — I10 Essential (primary) hypertension: Secondary | ICD-10-CM | POA: Diagnosis not present

## 2022-06-01 DIAGNOSIS — E291 Testicular hypofunction: Secondary | ICD-10-CM | POA: Diagnosis not present

## 2022-06-01 DIAGNOSIS — E785 Hyperlipidemia, unspecified: Secondary | ICD-10-CM | POA: Diagnosis not present

## 2022-08-30 ENCOUNTER — Other Ambulatory Visit: Payer: Self-pay | Admitting: Cardiology

## 2022-10-24 ENCOUNTER — Encounter (HOSPITAL_COMMUNITY): Payer: Self-pay

## 2022-10-24 ENCOUNTER — Emergency Department (HOSPITAL_COMMUNITY)
Admission: EM | Admit: 2022-10-24 | Discharge: 2022-10-24 | Disposition: A | Discharge status: Home / Self Care | Payer: Medicare HMO | Attending: Emergency Medicine | Admitting: Emergency Medicine

## 2022-10-24 ENCOUNTER — Emergency Department (HOSPITAL_COMMUNITY): Payer: Medicare HMO

## 2022-10-24 ENCOUNTER — Other Ambulatory Visit: Payer: Self-pay

## 2022-10-24 DIAGNOSIS — R531 Weakness: Secondary | ICD-10-CM

## 2022-10-24 DIAGNOSIS — Z0389 Encounter for observation for other suspected diseases and conditions ruled out: Secondary | ICD-10-CM | POA: Diagnosis not present

## 2022-10-24 DIAGNOSIS — R079 Chest pain, unspecified: Secondary | ICD-10-CM | POA: Diagnosis not present

## 2022-10-24 DIAGNOSIS — Z7982 Long term (current) use of aspirin: Secondary | ICD-10-CM | POA: Diagnosis not present

## 2022-10-24 DIAGNOSIS — R4781 Slurred speech: Secondary | ICD-10-CM | POA: Insufficient documentation

## 2022-10-24 DIAGNOSIS — G459 Transient cerebral ischemic attack, unspecified: Secondary | ICD-10-CM | POA: Diagnosis not present

## 2022-10-24 LAB — COMPREHENSIVE METABOLIC PANEL
ALT: 26 U/L (ref 0–44)
AST: 21 U/L (ref 15–41)
Albumin: 4.1 g/dL (ref 3.5–5.0)
Alkaline Phosphatase: 47 U/L (ref 38–126)
Anion gap: 13 (ref 5–15)
BUN: 9 mg/dL (ref 8–23)
CO2: 25 mmol/L (ref 22–32)
Calcium: 9.7 mg/dL (ref 8.9–10.3)
Chloride: 98 mmol/L (ref 98–111)
Creatinine, Ser: 1.08 mg/dL (ref 0.61–1.24)
GFR, Estimated: >60 mL/min (ref >60)
Glucose, Bld: 157 mg/dL — ABNORMAL HIGH (ref 70–99)
Potassium: 4.3 mmol/L (ref 3.5–5.1)
Sodium: 136 mmol/L (ref 135–145)
Total Bilirubin: 0.6 mg/dL (ref 0.3–1.2)
Total Protein: 7 g/dL (ref 6.5–8.1)

## 2022-10-24 LAB — URINALYSIS, ROUTINE W REFLEX MICROSCOPIC
Bilirubin Urine: NEGATIVE
Glucose, UA: NEGATIVE mg/dL
Hgb urine dipstick: NEGATIVE
Ketones, ur: NEGATIVE mg/dL
Leukocytes,Ua: NEGATIVE
Nitrite: NEGATIVE
Protein, ur: NEGATIVE mg/dL
Specific Gravity, Urine: 1.009 (ref 1.005–1.030)
pH: 5 (ref 5.0–8.0)

## 2022-10-24 LAB — PROTIME-INR
INR: 1.1 (ref 0.8–1.2)
Prothrombin Time: 13.9 seconds (ref 11.4–15.2)

## 2022-10-24 LAB — CBC WITH DIFFERENTIAL/PLATELET
Abs Immature Granulocytes: 0.04 K/uL (ref 0.00–0.07)
Basophils Absolute: 0.1 K/uL (ref 0.0–0.1)
Basophils Relative: 1 %
Eosinophils Absolute: 0.2 K/uL (ref 0.0–0.5)
Eosinophils Relative: 2 %
HCT: 42.5 % (ref 39.0–52.0)
Hemoglobin: 14.6 g/dL (ref 13.0–17.0)
Immature Granulocytes: 0 %
Lymphocytes Relative: 30 %
Lymphs Abs: 3.3 K/uL (ref 0.7–4.0)
MCH: 29.9 pg (ref 26.0–34.0)
MCHC: 34.4 g/dL (ref 30.0–36.0)
MCV: 87.1 fL (ref 80.0–100.0)
Monocytes Absolute: 0.8 K/uL (ref 0.1–1.0)
Monocytes Relative: 7 %
Neutro Abs: 6.7 K/uL (ref 1.7–7.7)
Neutrophils Relative %: 60 %
Platelets: 292 K/uL (ref 150–400)
RBC: 4.88 MIL/uL (ref 4.22–5.81)
RDW: 13.3 % (ref 11.5–15.5)
WBC: 11.2 K/uL — ABNORMAL HIGH (ref 4.0–10.5)
nRBC: 0 % (ref 0.0–0.2)

## 2022-10-24 LAB — APTT: aPTT: 29 s (ref 24–36)

## 2022-10-24 LAB — RAPID URINE DRUG SCREEN, HOSP PERFORMED
Amphetamines: NOT DETECTED
Barbiturates: NOT DETECTED
Benzodiazepines: NOT DETECTED
Cocaine: NOT DETECTED
Opiates: NOT DETECTED
Tetrahydrocannabinol: NOT DETECTED

## 2022-10-24 LAB — ETHANOL: Alcohol, Ethyl (B): <10 mg/dL (ref <10)

## 2022-10-24 LAB — TROPONIN I (HIGH SENSITIVITY): Troponin I (High Sensitivity): 7 ng/L

## 2022-10-24 MED ORDER — GADOBUTROL 1 MMOL/ML IV SOLN
9.0000 mL | Freq: Once | INTRAVENOUS | Status: AC | PRN
  Administered 2022-10-24: 9 mL via INTRAVENOUS

## 2022-10-24 NOTE — ED Triage Notes (Signed)
Pt states has been having discomfort around heart, SOB, brain fog and has been off balanced x4d. Pt's PCP sent him to r/o stroke. Pt denies N/V. Pt c/o numbness and tingling to left legx4d.

## 2022-10-24 NOTE — ED Provider Notes (Signed)
[facility] AT [facility] Provider Note   CSN: [id number] Arrival date & time: 10/24/22  1111     History  Hypertension Hyperlipidemia Diabetes II  Chief Complaint  Patient presents with   Stroke Symptoms    Roger Fernandez is a 67 y.o. male.  HPI  Roger Fernandez reports mild chest discomfort and slowed speech that started about four days ago. History is notable for prior episodes of chest pain, most recently in 02-2021 for which cardiac catheterization demonstrated moderate LAD calcification without occlusion. Current chest discomfort resembles that of prior episodes, but feels less intense. Describes the discomfort as dull ache that occurs in episodes. Denies pressure-like sensation, radiation of the discomfort, and any correlation to exertion.   Slowed speech also started about four days ago. Describes that it has been taking him longer to find the right words while speaking. Cognition seems a little slower than normal. Denies confusion, headache, and any recent trauma. Reports intermittent sensation of numbness involving left lower extremity and gait instability due to difficulty lifting his ipsilateral foot.      Home Medications Prior to Admission medications   Medication Sig Start Date End Date Taking? Authorizing Provider  Apoaequorin (PREVAGEN PO) Take 1 capsule by mouth daily.    [provider]  aspirin EC 81 MG tablet Take 81 mg by mouth daily. Swallow whole.    [provider]  atorvastatin (LIPITOR) 40 MG tablet Take 1 tablet (40 mg total) by mouth daily. Please keep scheduled appointment for future refills. Thank  you. 08/30/22   Quintella Reichert, MD  Cascara Sagrada 450 MG CAPS Take 450 mg by mouth daily.    [provider]  lisinopril-hydrochlorothiazide (PRINZIDE,ZESTORETIC) 20-12.5 MG per tablet Take 1 tablet by mouth daily.     [provider]  metFORMIN (GLUCOPHAGE) 500 MG tablet Take 500 mg by mouth 2  (two) times daily. 12/21/19   [provider]  metoprolol succinate (TOPROL-XL) 25 MG 24 hr tablet Take 1 tablet (25 mg total) by mouth daily. Please keep scheduled appointment for future refills. Thank  you. 08/30/22   Quintella Reichert, MD  nitroGLYCERIN (NITROSTAT) 0.4 MG SL tablet Place 1 tablet (0.4 mg total) under the tongue every 5 (five) minutes x 3 doses as needed for chest pain. 02/24/21   Arty Baumgartner, NP  PRESCRIPTION MEDICATION Apply 1 application topically daily. Testosterone compounded cream.    [provider]  Valerian 500 MG CAPS Take 500 mg by mouth daily as needed (calming.).    [provider]      Allergies    Patient has no known allergies.    Review of Systems   Review of Systems  Physical Exam Updated Vital Signs BP (!) 149/90   Pulse 73   Temp 97.9 F (36.6 C) (Oral)   Resp 17   Ht 6\' 1"  (1.854 m)   Wt 91.6 kg   SpO2 98%   BMI 26.64 kg/m  Physical Exam  Oriented to person-place-time, cooperative, not in acute distress Normal sinus rhythm, no chest pain with palpation over anterior chest wall Gait normal including tip-toe and tandem walking Sensation to light touch intact and symmetric across BUE and BLE Strength 5/5 throughout BUE and BLE Pupils equal and reactive to light Cerebellar function intact with finger-nose and heel-shin testing Cranial nerves II-VIII and XII-XIII intact   ED Results / Procedures / Treatments   Labs (all labs ordered are listed, but only  abnormal results are displayed) Labs Reviewed  CBC WITH DIFFERENTIAL/PLATELET - Abnormal; Notable for the following components:      Result Value   WBC 11.2 (*)    All other components within normal limits  COMPREHENSIVE METABOLIC PANEL - Abnormal; Notable for the following components:   Glucose, Bld 157 (*)    All other components within normal limits  ETHANOL  URINALYSIS, ROUTINE W REFLEX MICROSCOPIC  APTT  PROTIME-INR  RAPID URINE DRUG SCREEN, HOSP  PERFORMED  TROPONIN I (HIGH SENSITIVITY)    EKG EKG Interpretation  Date/Time:  Monday Oct 24 2022 11:26:44 EDT Ventricular Rate:  73 PR Interval:  186 QRS Duration: 90 QT Interval:  366 QTC Calculation: 403 R Axis:   74 Text Interpretation: Normal sinus rhythm When compared with ECG of 24-Feb-2021 11:40, PREVIOUS ECG IS PRESENT Confirmed by Virgina Norfolk ([id number]) on 10/24/2022 1:15:52 PM  Radiology CT Head Wo Contrast  Result Date: 10/24/2022 CLINICAL DATA:  TIA EXAM: CT HEAD WITHOUT CONTRAST TECHNIQUE: Contiguous axial images were obtained from the base of the skull through the vertex without intravenous contrast. RADIATION DOSE REDUCTION: This exam was performed according to the departmental dose-optimization program which includes automated exposure control, adjustment of the mA and/or kV according to patient size and/or use of iterative reconstruction technique. COMPARISON:  None Available. FINDINGS: Brain: No evidence of acute infarction, hemorrhage, hydrocephalus, extra-axial collection or mass lesion/mass effect. Linear hypodensity in the brachium pontis on the left is favored to be artifactual. Vascular: No hyperdense vessel or unexpected calcification. Skull: Normal. Negative for fracture or focal lesion. Sinuses/Orbits: Middle ear or mastoid effusion. Paranasal sinuses are clear. Orbits are unremarkable. Other: None. IMPRESSION: 1. No hemorrhage or CT evidence of an acute cortical infarct. 2. Linear hypodensity in the brachium pontis on the left is favored to be artifactual. If patient's symptoms could be localized to this region, further evaluation with a brain MRI could be considered Electronically Signed   By: Lorenza Cambridge M.D.   On: 10/24/2022 12:58    Procedures Procedures  none   Medications Ordered in ED Medications - No data to display  ED Course/ Medical Decision Making/ A&P   {   Click here for ABCD2, HEART and other calculators   ABCD2 Score: 5                       Medical Decision Making  Patient presented with sensations of left leg numbness, gait instability, and slowed speech. Symptoms started about four days ago, remained stable, and then largely resolved yesterday 5-6. Speech symptoms, specifically, have persisted and are present today. Head CT revealed linear hypodensity in middle cerebellar peduncle, possible artifactual. Handed patient off to attending physician, Dr Lockie Mola, with brain MRI pending. Further management will depend on future imaging findings.  Patient presented with atypical chest pain, subsequent workup with electrocardiogram and troponin levels was unremarkable. Low concern for acute coronary syndrome.   Final Clinical Impression(s) / ED Diagnoses Final diagnoses:  None    Rx / DC Orders ED Discharge Orders     None         Crissie Sickles, MD 10/24/22 1445    Virgina Norfolk, DO 10/24/22 [id number]

## 2022-10-24 NOTE — ED Provider Triage Note (Cosign Needed Addendum)
Emergency Medicine Provider Triage Evaluation Note  Roger Fernandez , a 67 y.o. male  was evaluated in triage.  Pt complains of concerns for possible mini stroke. Notes that he has had the symptoms for 4 days.  Has associated decreased sensation to his left leg x 4 days (notes since then going on longer than 4 days however it became noticeable 4 days ago).  Notes that he has felt off balance for the past 4 days.  Has associated discomfort around the heart, shortness of breath, brain fog.  Was seen by his primary care provider today who sent him to rule out a stroke in the emergency department today.  No anticoagulant use noted.   Review of Systems  Positive:  Negative:   Physical Exam  BP (!) 149/79   Pulse 73   Resp 16   SpO2 95%  Gen:   Awake, no distress   Resp:  Normal effort  MSK:   Moves extremities without difficulty  Other:  No focal neurological deficits. Negative pronator drift. Able to ambulate without assistance or difficulty. Strength and sensation intact to BUE and BLE. Grip strength 5/5 bilaterally.   Medical Decision Making  Medically screening exam initiated at 11:27 AM.  Appropriate orders placed.  Roger Fernandez was informed that the remainder of the evaluation will be completed by another provider, this initial triage assessment does not replace that evaluation, and the importance of remaining in the ED until their evaluation is complete.  Work-up initiated.    Deloy Archey A, PA-C 10/24/22 1131  1:04 PM -spoke to radiology who voiced concerns for infarct on CT scan that is in line with the patients symptoms and recommends MRI brain.    Dailynn Nancarrow A, PA-C 10/24/22 1311

## 2022-10-28 ENCOUNTER — Telehealth: Payer: Self-pay

## 2022-10-28 NOTE — Telephone Encounter (Signed)
Transition Care Management Unsuccessful Follow-up Telephone Call  Date of discharge and from where:  10/24/2022 [facility]  Attempts:  1st Attempt  Reason for unsuccessful TCM follow-up call:  Left voice message  Britiny Defrain [place]elle  [facility] Population Health Community Resource Care Guide   [email or web address]  ?? [phone]   Website: [email or web address]  [email or web address]      

## 2022-11-01 DIAGNOSIS — E119 Type 2 diabetes mellitus without complications: Secondary | ICD-10-CM | POA: Diagnosis not present

## 2022-11-01 DIAGNOSIS — I2510 Atherosclerotic heart disease of native coronary artery without angina pectoris: Secondary | ICD-10-CM | POA: Diagnosis not present

## 2022-11-01 DIAGNOSIS — G3184 Mild cognitive impairment, so stated: Secondary | ICD-10-CM | POA: Diagnosis not present

## 2022-11-01 DIAGNOSIS — I10 Essential (primary) hypertension: Secondary | ICD-10-CM | POA: Diagnosis not present

## 2022-11-01 DIAGNOSIS — I209 Angina pectoris, unspecified: Secondary | ICD-10-CM | POA: Diagnosis not present

## 2022-11-01 DIAGNOSIS — E1151 Type 2 diabetes mellitus with diabetic peripheral angiopathy without gangrene: Secondary | ICD-10-CM | POA: Diagnosis not present

## 2022-11-02 ENCOUNTER — Telehealth: Payer: Self-pay

## 2022-11-02 NOTE — Telephone Encounter (Signed)
Transition Care Management Unsuccessful Follow-up Telephone Call  Date of discharge and from where:  10/24/2022 [facility] [place]  Attempts:  2nd Attempt  Reason for unsuccessful TCM follow-up call:  Left voice message  Shenice Dolder [facility]  [facility] Population Health Community Resource Care Guide   [email or web address]  ?? [phone]   Website: [email or web address]  [email or web address]

## 2022-11-03 ENCOUNTER — Ambulatory Visit: Payer: Medicare HMO | Admitting: Neurology

## 2022-11-03 ENCOUNTER — Encounter: Payer: Self-pay | Admitting: Neurology

## 2022-11-03 ENCOUNTER — Telehealth: Payer: Self-pay | Admitting: Cardiology

## 2022-11-03 VITALS — BP 118/80 | Ht 73.0 in | Wt 203.0 lb

## 2022-11-03 DIAGNOSIS — R7982 Elevated C-reactive protein (CRP): Secondary | ICD-10-CM | POA: Diagnosis not present

## 2022-11-03 DIAGNOSIS — R700 Elevated erythrocyte sedimentation rate: Secondary | ICD-10-CM | POA: Diagnosis not present

## 2022-11-03 DIAGNOSIS — R5383 Other fatigue: Secondary | ICD-10-CM | POA: Diagnosis not present

## 2022-11-03 DIAGNOSIS — E538 Deficiency of other specified B group vitamins: Secondary | ICD-10-CM | POA: Diagnosis not present

## 2022-11-03 DIAGNOSIS — G4710 Hypersomnia, unspecified: Secondary | ICD-10-CM | POA: Diagnosis not present

## 2022-11-03 DIAGNOSIS — R7989 Other specified abnormal findings of blood chemistry: Secondary | ICD-10-CM | POA: Diagnosis not present

## 2022-11-03 DIAGNOSIS — R799 Abnormal finding of blood chemistry, unspecified: Secondary | ICD-10-CM | POA: Diagnosis not present

## 2022-11-03 NOTE — Telephone Encounter (Signed)
Pt c/o of Chest Pain: STAT if CP now or developed within 24 hours  1. Are you having CP right now? Discomfort in his chest- not at this time  2. Are you experiencing any other symptoms (ex. SOB, nausea, vomiting, sweating)? Shortness of breath, it comes and goes  3. How long have you been experiencing CP?  About 2 weeks-been to the ER and saw primary doctor- primary doctor told him to see Dr Mayford Knife  4. Is your CP continuous or coming and going?  comes and goes  5. Have you taken Nitroglycerin? No- patient has an appointment on 11-25-22, he wants to be seen before that date ?

## 2022-11-03 NOTE — Progress Notes (Signed)
Chief Complaint  Patient presents with   New Patient (Initial Visit)    Rm 14, with wife Roger Fernandez, new weakness LLE and slurred speech for 2 weeks,      ASSESSMENT AND PLAN  Roger Fernandez is a 67 y.o. male   Excessive daytime sleepiness, snoring, Slow processing time,  MRI of the brain May 2024 showed no significant abnormality,  History most suggestive of obstructive sleep apnea referral to sleep study,  Laboratory evaluations including TSH  DIAGNOSTIC DATA (LABS, IMAGING, TESTING) - I reviewed patient records, labs, notes, testing and imaging myself where available.   MEDICAL HISTORY:  Roger Fernandez, is a 67 year old male seen in request by his primary care from [facility] Dr. Martha Fernandez D for evaluation of new onset slurred speech, slow processing time, he is accompanied by his wife Roger Fernandez at today's clinical visit Nov 03, 2022  I reviewed and summarized the referring note. PMHX. HTN DM HLD CAD,  Cervical decompression surgery in 2012, left radiculopathy.  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  PHYSICAL EXAMNIATION:  Gen: NAD, conversant, well nourised, well  groomed                     Cardiovascular: Regular rate rhythm, no peripheral edema, warm, nontender. Eyes: Conjunctivae clear without exudates or hemorrhage Neck: Supple, no carotid bruits. Pulmonary: Clear to auscultation bilaterally   NEUROLOGICAL EXAM:  MENTAL STATUS: Speech/cognition: Awake, alert, oriented to history taking and casual conversation    11/03/2022    8:00 AM  Montreal Cognitive Assessment   Visuospatial/ Executive (0/5) 5  Naming (0/3) 3  Attention: Read list of digits (0/2) 1  Attention: Read list of letters (0/1) 0  Attention: Serial 7 subtraction starting at 100 (0/3) 3  Language: Repeat phrase (0/2) 2  Language : Fluency (0/1) 0  Abstraction (0/2) 2  Delayed Recall (0/5) 3  Orientation (0/6) 6  Total 25    CRANIAL NERVES: CN II: Visual fields are full to confrontation. Pupils are round equal and briskly reactive to light. CN III, IV, VI: extraocular movement are normal. No ptosis. CN V: Facial sensation is intact to light touch CN VII: Face is symmetric with normal eye closure  CN VIII: Hearing is normal to causal conversation. CN IX, X: Phonation is normal. CN XI: Head turning and shoulder shrug are intact CN XII: Narrow oropharyngeal space  MOTOR: There is no pronator drift of out-stretched arms. Muscle bulk and tone are normal. Muscle strength is normal.  REFLEXES: Reflexes are 2+ and symmetric at the biceps, triceps, knees, and ankles. Plantar responses are flexor.  SENSORY: Intact to  light touch, pinprick and vibratory sensation are intact in fingers and toes.  COORDINATION: There is no trunk or limb dysmetria noted.  GAIT/STANCE: Posture is normal. Gait is steady with normal steps, base, arm swing, and turning. Heel and toe walking are normal. Tandem gait is normal.  Romberg is absent.  REVIEW OF SYSTEMS:  Full 14 system review of systems performed and notable only for as above All other review of systems were  negative.   ALLERGIES: No Known Allergies  HOME MEDICATIONS: Current Outpatient Medications  Medication Sig Dispense Refill   Apoaequorin (PREVAGEN PO) Take 1 capsule by mouth daily.     aspirin EC 81 MG tablet Take 81 mg by mouth daily. Swallow whole.     atorvastatin (LIPITOR) 40 MG tablet Take 1 tablet (40 mg total) by mouth daily. Please keep scheduled appointment for future refills. Thank  you. 90 tablet 0   Cascara Sagrada 450 MG CAPS Take 450 mg by mouth daily.     lisinopril-hydrochlorothiazide (PRINZIDE,ZESTORETIC) 20-12.5 MG per tablet Take 1 tablet by mouth daily.      metFORMIN (GLUCOPHAGE) 500 MG tablet Take 500 mg by mouth in the morning. And 1,000mg  nightly     metoprolol succinate (TOPROL-XL) 25 MG 24 hr tablet Take 1 tablet (25 mg total) by mouth daily. Please keep scheduled appointment for future refills. Thank  you. 90 tablet 0   nitroGLYCERIN (NITROSTAT) 0.4 MG SL tablet Place 1 tablet (0.4 mg total) under the tongue every 5 (five) minutes x 3 doses as needed for chest pain. 25 tablet 2   PRESCRIPTION MEDICATION Apply 1 application topically daily. Testosterone compounded cream.     Turmeric (QC TUMERIC COMPLEX) 500 MG CAPS Take 1 capsule by mouth in the morning.     Valerian 500 MG CAPS Take 500 mg by mouth daily as needed (calming.).     No current facility-administered medications for this visit.    PAST MEDICAL HISTORY: Past Medical History:  Diagnosis Date   CAD (coronary artery disease), native coronary artery    cath 02/24/2021 which showed patent LM, moderately calcified LAD proximally up to 50% and 75-80% D2, patent LCx and RCA and LVEDP and medical therapy was recommended.  There was some thought that he could have microvascular angina.   Hyperlipidemia associated with type 2 diabetes mellitus (HCC)    Hypertension    DR Roger Fernandez PCP  DR Roger Fernandez CARDIAC   Type 2 diabetes mellitus [facility])     PAST SURGICAL HISTORY: Past Surgical History:   Procedure Laterality Date   ANTERIOR CERVICAL DECOMP/DISCECTOMY FUSION  06/06/2011   Procedure: ANTERIOR CERVICAL DECOMPRESSION/DISCECTOMY FUSION 3 LEVELS;  Surgeon: Hewitt Shorts;  Location: [facility] NEURO ORS;  Service: Neurosurgery;  Laterality: N/A;  Cervical Three-four,Cervical Four-Five,Cervical Five-Six anterior cervical decompression with fusion plating and bonegraft   APPENDECTOMY     LEFT HEART CATH AND CORONARY ANGIOGRAPHY N/A 02/24/2021   Procedure: LEFT HEART CATH AND CORONARY ANGIOGRAPHY;  Surgeon: Lyn Records, MD;  Location: [facility] INVASIVE CV LAB;  Service: Cardiovascular;  Laterality: N/A;   TONSILLECTOMY      FAMILY HISTORY: Family History  Problem Relation Age of Onset   Coronary artery disease Mother 61       died of MI   Kidney failure Mother    Bladder Cancer Father    Alcoholism Brother     SOCIAL HISTORY: Social History   Socioeconomic History   Marital status: Married    Spouse name:  Not on file   Number of children: Not on file   Years of education: Not on file   Highest education level: Not on file  Occupational History   Not on file  Tobacco Use   Smoking status: Some Days    Types: Cigars   Smokeless tobacco: Never  Substance and Sexual Activity   Alcohol use: Yes    Comment: OCC.   Drug use: No   Sexual activity: Not on file  Other Topics Concern   Not on file  Social History Narrative   Right handed   Lives at home with wife   Caffeine 8oz coffee daily   Social Determinants of Health   Financial Resource Strain: Not on file  Food Insecurity: Not on file  Transportation Needs: Not on file  Physical Activity: Not on file  Stress: Not on file  Social Connections: Not on file  Intimate Partner Violence: Not on file      Levert Feinstein, M.D. Ph.D.  [facility] [address] [place], [place] [address] Ph: [phone] Fax: [phone]  CC:  Virgina Norfolk, DO [address] [place],  [place] [address]  Cleatis Polka., MD

## 2022-11-03 NOTE — Telephone Encounter (Signed)
Called patient back to ensure he understands ED precautions, wife answered phone. No DPR on file,asked spouse to call the office at his earliest convenience.

## 2022-11-03 NOTE — Telephone Encounter (Signed)
Patient calling back. Patient states he has had SOB on exertion for the past few weeks and went to ED on 10/24/22 to be evaluated for SOB and CP. He was discharged and saw PCP on 10/25/22, where he was advised to f/u with cardiology.  He states SOB on exertion has continued, which does improve with rest. He also endorses brief episodes of chest discomfort and/or pain that wake him up at night. He states he has not been taking nitro for these episodes because they are very brief and resolve quickly on their own. He does not check his HR or BP at home but states his blood sugars have been good. He states he feels "better today" than he has in the last 2 weeks.  Patient has an appt w/ M. Lenze on 11/08/22. Advised patient in the meantime to proceed to ED/Call 911 if he experiences chest pain or SOB that does not get better with rest or with nitro x 3 doses. Patient verbalizes understanding.

## 2022-11-03 NOTE — Telephone Encounter (Signed)
Follow Up:    I found an appointment for patient on 11-08-22 with Jacolyn Reedy.

## 2022-11-04 LAB — RPR: RPR Ser Ql: NONREACTIVE

## 2022-11-04 LAB — CK: Total CK: 73 U/L (ref 41–331)

## 2022-11-04 LAB — VITAMIN B12: Vitamin B-12: 428 pg/mL (ref 232–1245)

## 2022-11-04 LAB — SEDIMENTATION RATE: Sed Rate: 2 mm/hr (ref 0–30)

## 2022-11-04 LAB — ANA W/REFLEX IF POSITIVE: Anti Nuclear Antibody (ANA): NEGATIVE

## 2022-11-04 LAB — TSH: TSH: 1.76 u[IU]/mL (ref 0.450–4.500)

## 2022-11-04 LAB — C-REACTIVE PROTEIN: CRP: 2 mg/L (ref 0–10)

## 2022-11-08 ENCOUNTER — Encounter: Payer: Self-pay | Admitting: Physician Assistant

## 2022-11-08 ENCOUNTER — Ambulatory Visit: Payer: Medicare HMO | Attending: Physician Assistant | Admitting: Physician Assistant

## 2022-11-08 VITALS — BP 128/70 | HR 81 | Ht 73.0 in | Wt 203.8 lb

## 2022-11-08 DIAGNOSIS — I2510 Atherosclerotic heart disease of native coronary artery without angina pectoris: Secondary | ICD-10-CM

## 2022-11-08 DIAGNOSIS — R072 Precordial pain: Secondary | ICD-10-CM

## 2022-11-08 DIAGNOSIS — E119 Type 2 diabetes mellitus without complications: Secondary | ICD-10-CM

## 2022-11-08 DIAGNOSIS — Z7984 Long term (current) use of oral hypoglycemic drugs: Secondary | ICD-10-CM

## 2022-11-08 DIAGNOSIS — I152 Hypertension secondary to endocrine disorders: Secondary | ICD-10-CM | POA: Diagnosis not present

## 2022-11-08 DIAGNOSIS — E1169 Type 2 diabetes mellitus with other specified complication: Secondary | ICD-10-CM | POA: Diagnosis not present

## 2022-11-08 DIAGNOSIS — E1159 Type 2 diabetes mellitus with other circulatory complications: Secondary | ICD-10-CM | POA: Diagnosis not present

## 2022-11-08 DIAGNOSIS — E785 Hyperlipidemia, unspecified: Secondary | ICD-10-CM

## 2022-11-08 MED ORDER — RANOLAZINE ER 500 MG PO TB12
500.0000 mg | ORAL_TABLET | Freq: Two times a day (BID) | ORAL | 3 refills | Status: DC
Start: 2022-11-08 — End: 2023-11-22

## 2022-11-08 MED ORDER — NITROGLYCERIN 0.4 MG SL SUBL
0.4000 mg | SUBLINGUAL_TABLET | SUBLINGUAL | 2 refills | Status: DC | PRN
Start: 2022-11-08 — End: 2024-01-16

## 2022-11-08 NOTE — Patient Instructions (Signed)
Medication Instructions:  Your physician has recommended you make the following change in your medication:   Start Ranexa 500 mg 2 times daily Hold metoprolol the night before your stress test   *If you need a refill on your cardiac medications before your next appointment, please call your pharmacy*   Lab Work: Lipids the day of stress test  If you have labs (blood work) drawn today and your tests are completely normal, you will receive your results only by: MyChart Message (if you have MyChart) OR A paper copy in the mail If you have any lab test that is abnormal or we need to change your treatment, we will call you to review the results.   Testing/Procedures: Your physician has requested that you have an exercise tolerance test. For further information please visit [email or web address] Please also follow instruction sheet, as given.    Follow-Up: At [facility], you and your health needs are our priority.  As part of our continuing mission to provide you with exceptional heart care, we have created designated Provider Care Teams.  These Care Teams include your primary Cardiologist (physician) and Advanced Practice Providers (APPs -  Physician Assistants and Nurse Practitioners) who all work together to provide you with the care you need, when you need it.  We recommend signing up for the patient portal called "MyChart".  Sign up information is provided on this After Visit Summary.  MyChart is used to connect with patients for Virtual Visits (Telemedicine).  Patients are able to view lab/test results, encounter notes, upcoming appointments, etc.  Non-urgent messages can be sent to your provider as well.   To learn more about what you can do with MyChart, go to [email or web address].    Your next appointment:   As scheduled with Dr Mayford Knife

## 2022-11-08 NOTE — Progress Notes (Signed)
Cardiology Office Note:    Date:  11/08/2022   ID:  Roger Fernandez, DOB [birth year], MRN [id number]  PCP:  Cleatis Polka., MD  [facility] Providers Cardiologist:  Armanda Magic, MD     Referring MD: Olevia Perches, NP   Chief Complaint:  Chest Pain     History of Present Illness:   Roger Fernandez is a 67 y.o. male with   a hx of HLD, DM2 and HTN who was initially referred for SOB and chest pain. He ultimately underwent cath 02/24/2021 which showed patent LM, moderately calcified LAD proximally up to 50% and 75-80% D2, patent LCx and RCA and LVEDP and medical therapy was recommended. There was some thought that he could have microvascular angina.   Patient last saw Dr. Mayford Knife 08/2021 and was doing well.   Went to ED 10/24/22 for exertional chest pain and DOE per patient but ED notes say he was there for slowed/slurred speech with left lower leg weakness and gait instability. No concerns of chest pan.  His neuroexam is unremarkable. Basic labs show no significant anemia or electrolyte abnormality or kidney injury. CT scan had possibly artifact finding but cannot rule out stroke and therefore MRI, MRA head and neck was obtained per neurology recommendations and overall were unremarkable.   Patient having a dull chest pain-woke him from sleep-lasted a couple minutes. No associated symptoms. Walks 2 miles a day and has no chest pain when he walks. He says chest pain comes and goes. Hasn't used NTG. No skipping. He also has some indigestion symptoms.  His wife says he seems more fatigued.       Past Medical History:  Diagnosis Date   CAD (coronary artery disease), native coronary artery    cath 02/24/2021 which showed patent LM, moderately calcified LAD proximally up to 50% and 75-80% D2, patent LCx and RCA and LVEDP and medical therapy was recommended.  There was some thought that he could have microvascular angina.   Hyperlipidemia associated with type 2 diabetes  mellitus (HCC)    Hypertension    DR Martha Clan PCP  DR [facility] CARDIAC   Type 2 diabetes mellitus (HCC)    Current Medications: Current Meds  Medication Sig   Apoaequorin (PREVAGEN PO) Take 1 capsule by mouth daily.   aspirin EC 81 MG tablet Take 81 mg by mouth daily. Swallow whole.   atorvastatin (LIPITOR) 40 MG tablet Take 1 tablet (40 mg total) by mouth daily. Please keep scheduled appointment for future refills. Thank  you.   Cascara Sagrada 450 MG CAPS Take 450 mg by mouth daily.   lisinopril-hydrochlorothiazide (PRINZIDE,ZESTORETIC) 20-12.5 MG per tablet Take 1 tablet by mouth daily.    metFORMIN (GLUCOPHAGE) 500 MG tablet Take 500 mg by mouth in the morning. And 1,000mg  nightly   metoprolol succinate (TOPROL-XL) 25 MG 24 hr tablet Take 1 tablet (25 mg total) by mouth daily. Please keep scheduled appointment for future refills. Thank  you.   PRESCRIPTION MEDICATION Apply 1 application topically daily. Testosterone compounded cream.   ranolazine (RANEXA) 500 MG 12 hr tablet Take 1 tablet (500 mg total) by mouth 2 (two) times daily.   Turmeric (QC TUMERIC COMPLEX) 500 MG CAPS Take 1 capsule by mouth in the morning.   Valerian 500 MG CAPS Take 500 mg by mouth daily as needed (calming.).   [DISCONTINUED] nitroGLYCERIN (NITROSTAT) 0.4 MG SL tablet Place 1 tablet (0.4 mg total) under the tongue every  5 (five) minutes x 3 doses as needed for chest pain.    Allergies:   Patient has no known allergies.   Social History   Tobacco Use   Smoking status: Some Days    Types: Cigars   Smokeless tobacco: Never  Substance Use Topics   Alcohol use: Yes    Comment: OCC.   Drug use: No    Family Hx: The patient's family history includes Alcoholism in his brother; Bladder Cancer in his father; Coronary artery disease (age of onset: 29) in his mother; Kidney failure in his mother.  ROS     Physical Exam:    VS:  BP 128/70   Pulse 81   Ht 6\' 1"  (1.854 m)   Wt 203 lb 12.8 oz (92.4 kg)    SpO2 99%   BMI 26.89 kg/m     Wt Readings from Last 3 Encounters:  11/08/22 203 lb 12.8 oz (92.4 kg)  11/03/22 203 lb (92.1 kg)  10/24/22 201 lb 15.1 oz (91.6 kg)    Physical Exam  GEN: Well nourished, well developed, in no acute distress  Neck: no JVD, carotid bruits, or masses Cardiac:RRR; no murmurs, rubs, or gallops  Respiratory:  clear to auscultation bilaterally, normal work of breathing GI: soft, nontender, nondistended, + BS Ext: without cyanosis, clubbing, or edema, Good distal pulses bilaterally Neuro:  Alert and Oriented x 3, Psych: euthymic mood, full affect        EKGs/Labs/Other Test Reviewed:    EKG:  EKG is  not ordered today.  The ekg  10/25/22 NSR with nonspecific ST changes unchanged.   Recent Labs: 10/24/2022: ALT 26; BUN 9; Creatinine, Ser 1.08; Hemoglobin 14.6; Platelets 292; Potassium 4.3; Sodium 136 11/03/2022: TSH 1.760   Recent Lipid Panel No results for input(s): "CHOL", "TRIG", "HDL", "VLDL", "LDLCALC", "LDLDIRECT" in the last 8760 hours.   Prior CV Studies: LEFT HEART CATH AND CORONARY ANGIOGRAPHY 02/24/2021  Narrative   Widely patent left main   Moderate LAD calcification with proximal to mid irregularities up to 50%.  Second diagonal 75 to 80% ostial.  Vessel is relatively small.   Widely patent circumflex   Widely patent dominant RCA with luminal irregularities noted   Normal LV function with LVEDP 18 mmHg.  Recommendation: Risk factor modification, medical therapy of hypertension, and consideration of alternative explanation for prolonged chest pain.  Microvascular dysfunction cannot be totally excluded as etiology.   ECHO COMPLETE WO IMAGING ENHANCING AGENT 02/24/2021  Narrative ECHOCARDIOGRAM REPORT    Patient Name:   Roger Fernandez Date of Exam: 02/24/2021 Medical Rec #:  [id number]      Height:       73.0 in Accession #:    [id number]     Weight:       198.0 lb Date of Birth:  Oct 17, 1955      BSA:          2.142 m Patient Age:     64 years       BP:           121/75 mmHg Patient Gender: M              HR:           70 bpm. Exam Location:  Inpatient  Procedure: 2D Echo, Cardiac Doppler and Color Doppler  Indications:    R07.9* Chest pain, unspecified  History:        Patient has no prior history of Echocardiogram examinations. Risk Factors:Dyslipidemia, Diabetes  and Hypertension.  Sonographer:    Eulah Pont RDCS Referring Phys: 14 DAYNA N DUNN  IMPRESSIONS   1. Left ventricular ejection fraction, by estimation, is 60 to 65%. The left ventricle has normal function. The left ventricle has no regional wall motion abnormalities. Left ventricular diastolic parameters were normal. 2. Right ventricular systolic function is normal. The right ventricular size is normal. There is normal pulmonary artery systolic pressure. 3. The mitral valve is normal in structure. Mild mitral valve regurgitation. No evidence of mitral stenosis. 4. The aortic valve is normal in structure. Aortic valve regurgitation is not visualized. No aortic stenosis is present. 5. The inferior vena cava is normal in size with <50% respiratory variability, suggesting right atrial pressure of 8 mmHg.  FINDINGS Left Ventricle: Left ventricular ejection fraction, by estimation, is 60 to 65%. The left ventricle has normal function. The left ventricle has no regional wall motion abnormalities. The left ventricular internal cavity size was normal in size. There is no left ventricular hypertrophy. Left ventricular diastolic parameters were normal. Normal left ventricular filling pressure.  Right Ventricle: The right ventricular size is normal. No increase in right ventricular wall thickness. Right ventricular systolic function is normal. There is normal pulmonary artery systolic pressure. The tricuspid regurgitant velocity is 1.80 m/s, and with an assumed right atrial pressure of 8 mmHg, the estimated right ventricular systolic pressure is 21.0 mmHg.  Left  Atrium: Left atrial size was normal in size.  Right Atrium: Right atrial size was normal in size.  Pericardium: There is no evidence of pericardial effusion.  Mitral Valve: The mitral valve is normal in structure. Mild mitral valve regurgitation. No evidence of mitral valve stenosis.  Tricuspid Valve: The tricuspid valve is normal in structure. Tricuspid valve regurgitation is trivial. No evidence of tricuspid stenosis.  Aortic Valve: The aortic valve is normal in structure. Aortic valve regurgitation is not visualized. No aortic stenosis is present.  Pulmonic Valve: The pulmonic valve was normal in structure. Pulmonic valve regurgitation is mild. No evidence of pulmonic stenosis.  Aorta: The aortic root is normal in size and structure.  Venous: The inferior vena cava is normal in size with less than 50% respiratory variability, suggesting right atrial pressure of 8 mmHg.  IAS/Shunts: No atrial level shunt detected by color flow Doppler.   LEFT VENTRICLE PLAX 2D LVIDd:         4.60 cm  Diastology LVIDs:         3.00 cm  LV e' medial:    7.43 cm/s LV PW:         0.90 cm  LV E/e' medial:  7.2 LV IVS:        1.00 cm  LV e' lateral:   9.60 cm/s LVOT diam:     2.10 cm  LV E/e' lateral: 5.6 LV SV:         61 LV SV Index:   28 LVOT Area:     3.46 cm   RIGHT VENTRICLE RV S prime:     12.20 cm/s TAPSE (M-mode): 1.8 cm  LEFT ATRIUM             Index       RIGHT ATRIUM           Index LA diam:        3.30 cm 1.54 cm/m  RA Area:     12.50 cm LA Vol (A2C):   33.8 ml 15.78 ml/m RA Volume:   27.30 ml  12.74  ml/m LA Vol (A4C):   33.4 ml 15.59 ml/m LA Biplane Vol: 33.7 ml 15.73 ml/m AORTIC VALVE LVOT Vmax:   86.40 cm/s LVOT Vmean:  56.600 cm/s LVOT VTI:    0.176 m  AORTA Ao Root diam: 3.20 cm Ao Asc diam:  3.30 cm  MITRAL VALVE               TRICUSPID VALVE MV Area (PHT): 2.91 cm    TR Peak grad:   13.0 mmHg MV Decel Time: 261 msec    TR Vmax:        180.00 cm/s MV E  velocity: 53.30 cm/s MV A velocity: 53.30 cm/s  SHUNTS MV E/A ratio:  1.00        Systemic VTI:  0.18 m Systemic Diam: 2.10 cm  Armanda Magic MD Electronically signed by Armanda Magic MD Signature Date/Time: 02/24/2021/9:34:46 AM    Final        Risk Assessment/Calculations/Metrics:              ASSESSMENT & PLAN:   No problem-specific Assessment & Plan notes found for this encounter.   CAD nonobstructive 2022 now with DOE and chest pain that comes and goes and not usually exertional.  Will add ranexa 500 mg bid, NTG prn, and do exercise myoview. Hold toprol the night before stress test. Discussed risks/benefits of lexiscan myoview.   HTN BP well controlled  HLD on lipitor -needs FLP  DM2-per PCP            Dispo:  No follow-ups on file.   Medication Adjustments/Labs and Tests Ordered: Current medicines are reviewed at length with the patient today.  Concerns regarding medicines are outlined above.  Tests Ordered: Orders Placed This Encounter  Procedures   Lipid panel   Cardiac Stress Test: Informed Consent Details: Physician/Practitioner Attestation; Transcribe to consent form and obtain patient signature   MYOCARDIAL PERFUSION IMAGING   Medication Changes: Meds ordered this encounter  Medications   nitroGLYCERIN (NITROSTAT) 0.4 MG SL tablet    Sig: Place 1 tablet (0.4 mg total) under the tongue every 5 (five) minutes x 3 doses as needed for chest pain.    Dispense:  25 tablet    Refill:  2   ranolazine (RANEXA) 500 MG 12 hr tablet    Sig: Take 1 tablet (500 mg total) by mouth 2 (two) times daily.    Dispense:  180 tablet    Refill:  3   Signed, Jacolyn Reedy, PA-C  11/08/2022 12:05 PM    [facility] [address] [place], [place], [place]  [address] Phone: [phone]; Fax: [phone]

## 2022-11-09 ENCOUNTER — Telehealth (HOSPITAL_COMMUNITY): Payer: Self-pay | Admitting: *Deleted

## 2022-11-09 NOTE — Telephone Encounter (Signed)
Patient given detailed instructions per Myocardial Perfusion Study Information Sheet for the test on 11/16/22  Patient notified to arrive 15 minutes early and that it is imperative to arrive on time for appointment to keep from having the test rescheduled.  If you need to cancel or reschedule your appointment, please call the office within 24 hours of your appointment. . Patient verbalized understanding.Ricky Ala

## 2022-11-16 ENCOUNTER — Ambulatory Visit (HOSPITAL_COMMUNITY): Payer: Medicare HMO | Attending: Internal Medicine

## 2022-11-16 ENCOUNTER — Ambulatory Visit: Payer: Medicare HMO

## 2022-11-16 DIAGNOSIS — I2510 Atherosclerotic heart disease of native coronary artery without angina pectoris: Secondary | ICD-10-CM | POA: Insufficient documentation

## 2022-11-16 DIAGNOSIS — E1159 Type 2 diabetes mellitus with other circulatory complications: Secondary | ICD-10-CM | POA: Insufficient documentation

## 2022-11-16 DIAGNOSIS — E785 Hyperlipidemia, unspecified: Secondary | ICD-10-CM | POA: Insufficient documentation

## 2022-11-16 DIAGNOSIS — R072 Precordial pain: Secondary | ICD-10-CM | POA: Insufficient documentation

## 2022-11-16 DIAGNOSIS — I152 Hypertension secondary to endocrine disorders: Secondary | ICD-10-CM | POA: Diagnosis not present

## 2022-11-16 DIAGNOSIS — E119 Type 2 diabetes mellitus without complications: Secondary | ICD-10-CM | POA: Diagnosis not present

## 2022-11-16 DIAGNOSIS — Z7984 Long term (current) use of oral hypoglycemic drugs: Secondary | ICD-10-CM | POA: Diagnosis not present

## 2022-11-16 DIAGNOSIS — E1169 Type 2 diabetes mellitus with other specified complication: Secondary | ICD-10-CM | POA: Insufficient documentation

## 2022-11-16 LAB — MYOCARDIAL PERFUSION IMAGING
Angina Index: 1
Duke Treadmill Score: 3
Estimated workload: 8.1
Exercise duration (min): 6 min
Exercise duration (sec): 45 s
LV dias vol: 75 mL (ref 62–150)
LV sys vol: 29 mL
MPHR: 152 {beats}/min
Nuc Stress EF: 61 %
Peak HR: 134 {beats}/min
Percent HR: 88 %
Rest HR: 68 {beats}/min
Rest Nuclear Isotope Dose: 10.8 mCi
SDS: 0
SRS: 0
SSS: 0
ST Depression (mm): 0 mm
Stress Nuclear Isotope Dose: 30.2 mCi
TID: 0.99

## 2022-11-16 MED ORDER — TECHNETIUM TC 99M TETROFOSMIN IV KIT
10.8000 | PACK | Freq: Once | INTRAVENOUS | Status: AC | PRN
  Administered 2022-11-16: 10.8 via INTRAVENOUS

## 2022-11-16 MED ORDER — TECHNETIUM TC 99M TETROFOSMIN IV KIT
30.2000 | PACK | Freq: Once | INTRAVENOUS | Status: AC | PRN
  Administered 2022-11-16: 30.2 via INTRAVENOUS

## 2022-11-17 LAB — LIPID PANEL
Chol/HDL Ratio: 2.4 ratio (ref 0.0–5.0)
Cholesterol, Total: 92 mg/dL — ABNORMAL LOW (ref 100–199)
HDL: 38 mg/dL — ABNORMAL LOW (ref >39)
LDL Chol Calc (NIH): 35 mg/dL (ref 0–99)
Triglycerides: 96 mg/dL (ref 0–149)
VLDL Cholesterol Cal: 19 mg/dL (ref 5–40)

## 2022-11-25 ENCOUNTER — Encounter: Payer: Self-pay | Admitting: Cardiology

## 2022-11-25 ENCOUNTER — Ambulatory Visit: Payer: Medicare HMO | Attending: Cardiology | Admitting: Cardiology

## 2022-11-25 VITALS — BP 138/62 | HR 77 | Ht 73.0 in | Wt 202.6 lb

## 2022-11-25 DIAGNOSIS — I2510 Atherosclerotic heart disease of native coronary artery without angina pectoris: Secondary | ICD-10-CM | POA: Diagnosis not present

## 2022-11-25 DIAGNOSIS — G459 Transient cerebral ischemic attack, unspecified: Secondary | ICD-10-CM

## 2022-11-25 DIAGNOSIS — E785 Hyperlipidemia, unspecified: Secondary | ICD-10-CM

## 2022-11-25 DIAGNOSIS — E1159 Type 2 diabetes mellitus with other circulatory complications: Secondary | ICD-10-CM | POA: Diagnosis not present

## 2022-11-25 DIAGNOSIS — I152 Hypertension secondary to endocrine disorders: Secondary | ICD-10-CM | POA: Diagnosis not present

## 2022-11-25 DIAGNOSIS — Z7984 Long term (current) use of oral hypoglycemic drugs: Secondary | ICD-10-CM

## 2022-11-25 DIAGNOSIS — E1169 Type 2 diabetes mellitus with other specified complication: Secondary | ICD-10-CM

## 2022-11-25 NOTE — Progress Notes (Signed)
Cardiology H&P  Date:  11/25/2022   ID:  Roger Fernandez, DOB 28-May-1956, MRN [id number]  PCP:  Cleatis Polka., MD  Cardiologist:  Armanda Magic, MD   Chief Complaint  Patient presents with   Coronary Artery Disease   Hypertension   Hyperlipidemia     History of Present Illness:  Roger Fernandez is a 67 y.o. male  with a hx of HLD, DM2 and HTN who was initially referred for SOB and chest pain. He ultimately underwent cath 02/24/2021 which showed patent LM, moderately calcified LAD proximally up to 50% and 75-80% D2, patent LCx and RCA and LVEDP and medical therapy was recommended.  There was some thought that he could have microvascular angina.    He was seen by Herma Carson, PA 11/08/2022 for chest pain and SOB and underwent and showed no ischemia and normal LVF. His CP has subsided and his only compliant now is fatigue. He walks 2 miles daily and has no sx of SOB or CP. He was also complaining of stroke like sx and went to the ER but workup was normal. He was seen by Neuro and nothing could be found.  He was complaining of daytime fatigue and a sleep study is pending.   He is here today for followup and is doing well.  He denies any chest pain or pressure, SOB, DOE (unless exerting himself in the humidity), PND, orthopnea, LE edema, dizziness, palpitations or syncope. He is compliant with his meds and is tolerating meds with no SE.    Past Medical History:  Diagnosis Date   CAD (coronary artery disease), native coronary artery    cath 02/24/2021 which showed patent LM, moderately calcified LAD proximally up to 50% and 75-80% D2, patent LCx and RCA and LVEDP and medical therapy was recommended.  There was some thought that he could have microvascular angina.   Hyperlipidemia associated with type 2 diabetes mellitus (HCC)    Hypertension    DR Martha Clan PCP  DR [facility]   Type 2 diabetes mellitus [facility])     Past Surgical History:  Procedure Laterality  Date   ANTERIOR CERVICAL DECOMP/DISCECTOMY FUSION  06/06/2011   Procedure: ANTERIOR CERVICAL DECOMPRESSION/DISCECTOMY FUSION 3 LEVELS;  Surgeon: Hewitt Shorts;  Location: [facility] NEURO ORS;  Service: Neurosurgery;  Laterality: N/A;  Cervical Three-four,Cervical Four-Five,Cervical Five-Six anterior cervical decompression with fusion plating and bonegraft   APPENDECTOMY     LEFT HEART CATH AND CORONARY ANGIOGRAPHY N/A 02/24/2021   Procedure: LEFT HEART CATH AND CORONARY ANGIOGRAPHY;  Surgeon: Lyn Records, MD;  Location: [facility] INVASIVE CV LAB;  Service: Cardiovascular;  Laterality: N/A;   TONSILLECTOMY      Current Medications: Current Meds  Medication Sig   Apoaequorin (PREVAGEN PO) Take 1 capsule by mouth daily.   aspirin EC 81 MG tablet Take 81 mg by mouth daily. Swallow whole.   atorvastatin (LIPITOR) 40 MG tablet Take 1 tablet (40 mg total) by mouth daily. Please keep scheduled appointment for future refills. Thank  you.   Cascara Sagrada 450 MG CAPS Take 450 mg by mouth daily.   lisinopril-hydrochlorothiazide (PRINZIDE,ZESTORETIC) 20-12.5 MG per tablet Take 1 tablet by mouth daily.    metFORMIN (GLUCOPHAGE) 500 MG tablet Take 500 mg by mouth in the morning. And 1,000mg  nightly   metoprolol succinate (TOPROL-XL) 25 MG 24 hr tablet Take 1 tablet (25 mg total) by mouth daily. Please keep scheduled appointment for future  refills. Thank  you.   nitroGLYCERIN (NITROSTAT) 0.4 MG SL tablet Place 1 tablet (0.4 mg total) under the tongue every 5 (five) minutes x 3 doses as needed for chest pain.   PRESCRIPTION MEDICATION Apply 1 application topically daily. Testosterone compounded cream.   ranolazine (RANEXA) 500 MG 12 hr tablet Take 1 tablet (500 mg total) by mouth 2 (two) times daily.   Turmeric (QC TUMERIC COMPLEX) 500 MG CAPS Take 1 capsule by mouth in the morning.   Valerian 500 MG CAPS Take 500 mg by mouth daily as needed (calming.).    Allergies:   Patient has no known allergies.   Social  History   Socioeconomic History   Marital status: Married    Spouse name: Not on file   Number of children: Not on file   Years of education: Not on file   Highest education level: Not on file  Occupational History   Not on file  Tobacco Use   Smoking status: Some Days    Types: Cigars   Smokeless tobacco: Never  Substance and Sexual Activity   Alcohol use: Yes    Comment: OCC.   Drug use: No   Sexual activity: Not on file  Other Topics Concern   Not on file  Social History Narrative   Right handed   Lives at home with wife   Caffeine 8oz coffee daily   Social Determinants of Health   Financial Resource Strain: Not on file  Food Insecurity: Not on file  Transportation Needs: Not on file  Physical Activity: Not on file  Stress: Not on file  Social Connections: Not on file     Family History:  The patient's family history includes Alcoholism in his brother; Bladder Cancer in his father; Coronary artery disease (age of onset: 90) in his mother; Kidney failure in his mother.   ROS:   Please see the history of present illness.    ROS All other systems reviewed and are negative.      No data to display             PHYSICAL EXAM:   VS:  BP 138/62   Pulse 77   Ht 6\' 1"  (1.854 m)   Wt 202 lb 9.6 oz (91.9 kg)   SpO2 98%   BMI 26.73 kg/m     Wt Readings from Last 3 Encounters:  11/25/22 202 lb 9.6 oz (91.9 kg)  11/16/22 203 lb (92.1 kg)  11/08/22 203 lb 12.8 oz (92.4 kg)    GEN: Well nourished, well developed in no acute distress HEENT: Normal NECK: No JVD; No carotid bruits LYMPHATICS: No lymphadenopathy CARDIAC:RRR, no murmurs, rubs, gallops RESPIRATORY:  Clear to auscultation without rales, wheezing or rhonchi  ABDOMEN: Soft, non-tender, non-distended MUSCULOSKELETAL:  No edema; No deformity  SKIN: Warm and dry NEUROLOGIC:  Alert and oriented x 3 PSYCHIATRIC:  Normal affect   Studies/Labs Reviewed:   EKG:  EKG is not ordered today   Recent  Labs: 10/24/2022: ALT 26; BUN 9; Creatinine, Ser 1.08; Hemoglobin 14.6; Platelets 292; Potassium 4.3; Sodium 136 11/03/2022: TSH 1.760   Lipid Panel    Component Value Date/Time   CHOL 92 (L) 11/16/2022 0756   TRIG 96 11/16/2022 0756   HDL 38 (L) 11/16/2022 0756   CHOLHDL 2.4 11/16/2022 0756   CHOLHDL 3.0 02/24/2021 0259   VLDL 32 02/24/2021 0259   LDLCALC 35 11/16/2022 0756   Additional studies/ records that were reviewed today include:  OV notes  from PCP    ASSESSMENT:    1. Coronary artery disease involving native coronary artery of native heart without angina pectoris   2. Hypertension associated with diabetes (HCC)   3. Hyperlipidemia associated with type 2 diabetes mellitus (HCC)     PLAN:  In order of problems listed above:  ASCAD -cath 02/24/2021 showed patent LM, moderately calcified LAD proximally up to 50% and 75-80% D2, patent LCx and RCA and LVEDP and medical therapy was recommended.  There was some thought that he could have microvascular angina. -recently seen by Herma Carson PA for CP and SOB with normal nuclear stress test and now is walking 2 miles daily with no CP or SOB. He was started on Ranexa for possible microvascalar angina.   -Continue prescription drug management with aspirin 81 mg daily, Ranexa 500mg  BID, Toprol-XL 25 mg daily and atorvastatin 40 mg daily with as needed refills  2.  HTN -BP is well controlled on exam today -Continue prescription drug management with Toprol-XL 25 mg daily and lisinopril HCT 20-12.5 mg daily with as needed refills -I have personally reviewed and interpreted outside labs performed by patient's PCP which showed serum creatinine 1.08 and potassium 4.3 on 10/24/2022  3.  HLD -LDL goal < 70  -I have personally reviewed and interpreted outside labs performed by patient's PCP which showed LDL 35 and HDL 38 on 11/16/2022 ALT was normal at 26 -Continue prescription drug management atorvastatin 40 mg daily with as needed  refills  4.  Stroke like sx -recent ER visit for with NAD on head CT or MRI -seeing neuro  -will get 2D echo to assess LVF   Medication Adjustments/Labs and Tests Ordered: Current medicines are reviewed at length with the patient today.  Concerns regarding medicines are outlined above.  Medication changes, Labs and Tests ordered today are listed in the Patient Instructions below.  Followup with me in 6 months   Signed, Armanda Magic, MD  11/25/2022 9:23 AM    [facility] [address] [place], [place], [place]  [address] Phone: [phone]; Fax: [phone]

## 2022-11-25 NOTE — Addendum Note (Signed)
Addended by: Luellen Pucker on: 11/25/2022 09:39 AM   Modules accepted: Orders

## 2022-11-25 NOTE — Patient Instructions (Signed)
Medication Instructions:  Your physician recommends that you continue on your current medications as directed. Please refer to the Current Medication list given to you today.  *If you need a refill on your cardiac medications before your next appointment, please call your pharmacy*   Lab Work: None. If you have labs (blood work) drawn today and your tests are completely normal, you will receive your results only by: MyChart Message (if you have MyChart) OR A paper copy in the mail If you have any lab test that is abnormal or we need to change your treatment, we will call you to review the results.   Testing/Procedures: Your physician has requested that you have an echocardiogram. Echocardiography is a painless test that uses sound waves to create images of your heart. It provides your doctor with information about the size and shape of your heart and how well your heart's chambers and valves are working. This procedure takes approximately one hour. There are no restrictions for this procedure. Please do NOT wear cologne, perfume, aftershave, or lotions (deodorant is allowed). Please arrive 15 minutes prior to your appointment time.    Follow-Up: At [facility], you and your health needs are our priority.  As part of our continuing mission to provide you with exceptional heart care, we have created designated Provider Care Teams.  These Care Teams include your primary Cardiologist (physician) and Advanced Practice Providers (APPs -  Physician Assistants and Nurse Practitioners) who all work together to provide you with the care you need, when you need it.  We recommend signing up for the patient portal called "MyChart".  Sign up information is provided on this After Visit Summary.  MyChart is used to connect with patients for Virtual Visits (Telemedicine).  Patients are able to view lab/test results, encounter notes, upcoming appointments, etc.  Non-urgent messages can be sent to  your provider as well.   To learn more about what you can do with MyChart, go to [email or web address].    Your next appointment:   6 month(s)  Provider:   Traci Turner, MD     

## 2022-12-02 NOTE — Progress Notes (Signed)
ok 

## 2022-12-28 ENCOUNTER — Ambulatory Visit (HOSPITAL_COMMUNITY): Payer: Medicare HMO | Attending: Internal Medicine

## 2022-12-28 DIAGNOSIS — G459 Transient cerebral ischemic attack, unspecified: Secondary | ICD-10-CM | POA: Diagnosis not present

## 2022-12-28 LAB — ECHOCARDIOGRAM COMPLETE
Area-P 1/2: 2.63 cm2
S' Lateral: 2.3 cm

## 2022-12-30 ENCOUNTER — Telehealth: Payer: Self-pay | Admitting: Cardiology

## 2022-12-30 NOTE — Telephone Encounter (Signed)
Return pts call about results. Results given.

## 2022-12-30 NOTE — Telephone Encounter (Signed)
Pt returning nurses call regarding results. Please advise 

## 2023-01-02 ENCOUNTER — Other Ambulatory Visit: Payer: Self-pay | Admitting: Cardiology

## 2023-01-06 ENCOUNTER — Telehealth: Payer: Self-pay

## 2023-01-06 NOTE — Telephone Encounter (Signed)
-----   Message from Armanda Magic sent at 12/28/2022 12:04 PM EDT ----- 2D echo showed normal heart function with increased stiffness of heart muscle related to aging otherwise normal echo

## 2023-01-06 NOTE — Telephone Encounter (Signed)
Called patient to review 2D echo results. Patient verbalizes understanding of normal pumping function and increased stiffness of heart muscle related to aging.

## 2023-01-10 DIAGNOSIS — I2510 Atherosclerotic heart disease of native coronary artery without angina pectoris: Secondary | ICD-10-CM | POA: Diagnosis not present

## 2023-01-10 DIAGNOSIS — I10 Essential (primary) hypertension: Secondary | ICD-10-CM | POA: Diagnosis not present

## 2023-01-10 DIAGNOSIS — E291 Testicular hypofunction: Secondary | ICD-10-CM | POA: Diagnosis not present

## 2023-01-10 DIAGNOSIS — E119 Type 2 diabetes mellitus without complications: Secondary | ICD-10-CM | POA: Diagnosis not present

## 2023-01-10 DIAGNOSIS — Z125 Encounter for screening for malignant neoplasm of prostate: Secondary | ICD-10-CM | POA: Diagnosis not present

## 2023-01-10 DIAGNOSIS — E785 Hyperlipidemia, unspecified: Secondary | ICD-10-CM | POA: Diagnosis not present

## 2023-01-10 DIAGNOSIS — Z1212 Encounter for screening for malignant neoplasm of rectum: Secondary | ICD-10-CM | POA: Diagnosis not present

## 2023-01-13 DIAGNOSIS — E785 Hyperlipidemia, unspecified: Secondary | ICD-10-CM | POA: Diagnosis not present

## 2023-01-13 DIAGNOSIS — I10 Essential (primary) hypertension: Secondary | ICD-10-CM | POA: Diagnosis not present

## 2023-01-13 DIAGNOSIS — I2510 Atherosclerotic heart disease of native coronary artery without angina pectoris: Secondary | ICD-10-CM | POA: Diagnosis not present

## 2023-01-17 DIAGNOSIS — R82998 Other abnormal findings in urine: Secondary | ICD-10-CM | POA: Diagnosis not present

## 2023-01-17 DIAGNOSIS — G3184 Mild cognitive impairment, so stated: Secondary | ICD-10-CM | POA: Diagnosis not present

## 2023-01-17 DIAGNOSIS — Z0000 Encounter for general adult medical examination without abnormal findings: Secondary | ICD-10-CM | POA: Diagnosis not present

## 2023-01-17 DIAGNOSIS — Z1331 Encounter for screening for depression: Secondary | ICD-10-CM | POA: Diagnosis not present

## 2023-01-17 DIAGNOSIS — E785 Hyperlipidemia, unspecified: Secondary | ICD-10-CM | POA: Diagnosis not present

## 2023-01-17 DIAGNOSIS — N529 Male erectile dysfunction, unspecified: Secondary | ICD-10-CM | POA: Diagnosis not present

## 2023-01-17 DIAGNOSIS — I2510 Atherosclerotic heart disease of native coronary artery without angina pectoris: Secondary | ICD-10-CM | POA: Diagnosis not present

## 2023-01-17 DIAGNOSIS — F17290 Nicotine dependence, other tobacco product, uncomplicated: Secondary | ICD-10-CM | POA: Diagnosis not present

## 2023-01-17 DIAGNOSIS — Z1339 Encounter for screening examination for other mental health and behavioral disorders: Secondary | ICD-10-CM | POA: Diagnosis not present

## 2023-01-17 DIAGNOSIS — I209 Angina pectoris, unspecified: Secondary | ICD-10-CM | POA: Diagnosis not present

## 2023-01-17 DIAGNOSIS — I10 Essential (primary) hypertension: Secondary | ICD-10-CM | POA: Diagnosis not present

## 2023-01-17 DIAGNOSIS — E1169 Type 2 diabetes mellitus with other specified complication: Secondary | ICD-10-CM | POA: Diagnosis not present

## 2023-05-08 ENCOUNTER — Encounter: Payer: Self-pay | Admitting: Cardiology

## 2023-05-08 ENCOUNTER — Ambulatory Visit: Payer: Medicare HMO | Attending: Cardiology | Admitting: Cardiology

## 2023-05-08 VITALS — BP 128/66 | HR 74 | Ht 73.0 in | Wt 205.8 lb

## 2023-05-08 DIAGNOSIS — I152 Hypertension secondary to endocrine disorders: Secondary | ICD-10-CM

## 2023-05-08 DIAGNOSIS — E1169 Type 2 diabetes mellitus with other specified complication: Secondary | ICD-10-CM

## 2023-05-08 DIAGNOSIS — E785 Hyperlipidemia, unspecified: Secondary | ICD-10-CM | POA: Diagnosis not present

## 2023-05-08 DIAGNOSIS — E1159 Type 2 diabetes mellitus with other circulatory complications: Secondary | ICD-10-CM

## 2023-05-08 DIAGNOSIS — I2510 Atherosclerotic heart disease of native coronary artery without angina pectoris: Secondary | ICD-10-CM

## 2023-05-08 NOTE — Progress Notes (Signed)
Cardiology H&P  Date:  05/08/2023   ID:  [name], DOB 05-04-1956, MRN [id number]  PCP:  Cleatis Polka., MD  Cardiologist:  Armanda Magic, MD   Chief Complaint  Patient presents with   Coronary Artery Disease   Hypertension   Hyperlipidemia     History of Present Illness:  Roger Fernandez is a 67 y.o. male  with a hx of HLD, DM2 and HTN who was initially referred for SOB and chest pain. He ultimately underwent cath 02/24/2021 which showed patent LM, moderately calcified LAD proximally up to 50% and 75-80% D2, patent LCx and RCA and LVEDP and medical therapy was recommended.  There was some thought that he could have microvascular angina.    He was seen by Herma Carson, PA 11/08/2022 for chest pain and SOB and underwent stress testing and showed no ischemia and normal LVF.  When I last saw him his chest pain had resolved and his only complaint at that time was fatigue.  He was able to walk 2 miles daily and has had no sx of SOB or CP. He was also complaining of stroke like sx and went to the ER but workup was normal. He was seen by Neuro and nothing could be found.  He was complaining of daytime fatigue and a sleep study was ordered but never completed  He is here today for followup and is doing well.  He denies any chest pain or pressure, SOB, DOE, PND, orthopnea, LE edema, dizziness, palpitations or syncope. He is compliant with his meds and is tolerating meds with no SE.    Past Medical History:  Diagnosis Date   CAD (coronary artery disease), native coronary artery    cath 02/24/2021 which showed patent LM, moderately calcified LAD proximally up to 50% and 75-80% D2, patent LCx and RCA and LVEDP and medical therapy was recommended.  There was some thought that he could have microvascular angina.   Hyperlipidemia associated with type 2 diabetes mellitus (HCC)    Hypertension    DR Martha Clan PCP  DR [facility] CARDIAC   Type 2 diabetes mellitus [facility])      Past Surgical History:  Procedure Laterality Date   ANTERIOR CERVICAL DECOMP/DISCECTOMY FUSION  06/06/2011   Procedure: ANTERIOR CERVICAL DECOMPRESSION/DISCECTOMY FUSION 3 LEVELS;  Surgeon: Hewitt Shorts;  Location: [facility] NEURO ORS;  Service: Neurosurgery;  Laterality: N/A;  Cervical Three-four,Cervical Four-Five,Cervical Five-Six anterior cervical decompression with fusion plating and bonegraft   APPENDECTOMY     LEFT HEART CATH AND CORONARY ANGIOGRAPHY N/A 02/24/2021   Procedure: LEFT HEART CATH AND CORONARY ANGIOGRAPHY;  Surgeon: Lyn Records, MD;  Location: [facility] INVASIVE CV LAB;  Service: Cardiovascular;  Laterality: N/A;   TONSILLECTOMY      Current Medications: Current Meds  Medication Sig   Apoaequorin (PREVAGEN PO) Take 1 capsule by mouth daily.   aspirin EC 81 MG tablet Take 81 mg by mouth daily. Swallow whole.   atorvastatin (LIPITOR) 40 MG tablet Take 1 tablet (40 mg total) by mouth daily.   Cascara Sagrada 450 MG CAPS Take 450 mg by mouth daily.   lisinopril-hydrochlorothiazide (PRINZIDE,ZESTORETIC) 20-12.5 MG per tablet Take 1 tablet by mouth daily.    metFORMIN (GLUCOPHAGE) 1000 MG tablet Take 1,000 mg by mouth 2 (two) times daily.   metoprolol succinate (TOPROL-XL) 25 MG 24 hr tablet Take 1 tablet (25 mg total) by mouth daily.   nitroGLYCERIN (NITROSTAT) 0.4 MG  SL tablet Place 1 tablet (0.4 mg total) under the tongue every 5 (five) minutes x 3 doses as needed for chest pain.   PRESCRIPTION MEDICATION Apply 1 application topically daily. Testosterone compounded cream.   ranolazine (RANEXA) 500 MG 12 hr tablet Take 1 tablet (500 mg total) by mouth 2 (two) times daily.   Turmeric (QC TUMERIC COMPLEX) 500 MG CAPS Take 1 capsule by mouth in the morning.   Valerian 500 MG CAPS Take 500 mg by mouth daily as needed (calming.).   [DISCONTINUED] metFORMIN (GLUCOPHAGE) 500 MG tablet Take 500 mg by mouth in the morning. And 1,000mg  nightly    Allergies:   Patient has no known  allergies.   Social History   Socioeconomic History   Marital status: Married    Spouse name: Not on file   Number of children: Not on file   Years of education: Not on file   Highest education level: Not on file  Occupational History   Not on file  Tobacco Use   Smoking status: Some Days    Types: Cigars   Smokeless tobacco: Never  Substance and Sexual Activity   Alcohol use: Yes    Comment: OCC.   Drug use: No   Sexual activity: Not on file  Other Topics Concern   Not on file  Social History Narrative   Right handed   Lives at home with wife   Caffeine 8oz coffee daily   Social Determinants of Health   Financial Resource Strain: Not on file  Food Insecurity: Not on file  Transportation Needs: Not on file  Physical Activity: Not on file  Stress: Not on file  Social Connections: Not on file     Family History:  The patient's family history includes Alcoholism in his brother; Bladder Cancer in his father; Coronary artery disease (age of onset: 16) in his mother; Kidney failure in his mother.   ROS:   Please see the history of present illness.    ROS All other systems reviewed and are negative.      No data to display             PHYSICAL EXAM:   VS:  BP 128/66   Pulse 74   Ht 6\' 1"  (1.854 m)   Wt 205 lb 12.8 oz (93.4 kg)   SpO2 99%   BMI 27.15 kg/m     Wt Readings from Last 3 Encounters:  05/08/23 205 lb 12.8 oz (93.4 kg)  11/25/22 202 lb 9.6 oz (91.9 kg)  11/16/22 203 lb (92.1 kg)    GEN: Well nourished, well developed in no acute distress HEENT: Normal NECK: No JVD; No carotid bruits LYMPHATICS: No lymphadenopathy CARDIAC:RRR, no murmurs, rubs, gallops RESPIRATORY:  Clear to auscultation without rales, wheezing or rhonchi  ABDOMEN: Soft, non-tender, non-distended MUSCULOSKELETAL:  No edema; No deformity  SKIN: Warm and dry NEUROLOGIC:  Alert and oriented x 3 PSYCHIATRIC:  Normal affect  Studies/Labs Reviewed:    Recent  Labs: 10/24/2022: ALT 26; BUN 9; Creatinine, Ser 1.08; Hemoglobin 14.6; Platelets 292; Potassium 4.3; Sodium 136 11/03/2022: TSH 1.760   Lipid Panel    Component Value Date/Time   CHOL 92 (L) 11/16/2022 0756   TRIG 96 11/16/2022 0756   HDL 38 (L) 11/16/2022 0756   CHOLHDL 2.4 11/16/2022 0756   CHOLHDL 3.0 02/24/2021 0259   VLDL 32 02/24/2021 0259   LDLCALC 35 11/16/2022 0756   Additional studies/ records that were reviewed today include:  OV notes  from PCP    ASSESSMENT:    1. Coronary artery disease involving native coronary artery of native heart without angina pectoris   2. Hypertension associated with diabetes (HCC)   3. Hyperlipidemia associated with type 2 diabetes mellitus (HCC)      PLAN:  In order of problems listed above:  ASCAD -cath 02/24/2021 showed patent LM, moderately calcified LAD proximally up to 50% and 75-80% D2, patent LCx and RCA and LVEDP and medical therapy was recommended.  There was some thought that he could have microvascular angina. -He was started on Ranexa for possible microvascalar angina.   -Nuclear stress test showed no ischemia -Continue prescription drug managed with aspirin 81 mg daily, atorvastatin 40 mg daily, Toprol-XL 25 mg daily and Ranexa 500 mg twice daily with as needed refills  2.  HTN -BP is adequate controlled on exam today -Continue prescription drug management with lisinopril HCT 20-12.5 mg daily, Toprol-XL 25 mg daily with as needed refills -I have personally reviewed and interpreted outside labs performed by patient's PCP which showed serum creatinine 1.08 potassium 4.3 on 10/24/2022  3.  HLD -LDL goal < 70  -I have personally reviewed and interpreted outside labs performed by patient's PCP which showed LDL 35 and HDL 38 on 11/16/2022 -Continue drug management with atorvastatin 40 mg daily with as needed refills   Medication Adjustments/Labs and Tests Ordered: Current medicines are reviewed at length with the patient  today.  Concerns regarding medicines are outlined above.  Medication changes, Labs and Tests ordered today are listed in the Patient Instructions below.  Followup with me in 1 year   Signed, Armanda Magic, MD  05/08/2023 10:48 AM    [facility] [address] [place], [place], [place]  [address] Phone: [phone]; Fax: [phone]

## 2023-05-08 NOTE — Patient Instructions (Signed)
Medication Instructions:  Your physician recommends that you continue on your current medications as directed. Please refer to the Current Medication list given to you today.  *If you need a refill on your cardiac medications before your next appointment, please call your pharmacy*   Lab Work: None.  If you have labs (blood work) drawn today and your tests are completely normal, you will receive your results only by: MyChart Message (if you have MyChart) OR A paper copy in the mail If you have any lab test that is abnormal or we need to change your treatment, we will call you to review the results.   Testing/Procedures: None.   Follow-Up:   Your next appointment:   1 year(s)  Provider:   Traci Turner, MD     

## 2023-06-09 DIAGNOSIS — H02882 Meibomian gland dysfunction right lower eyelid: Secondary | ICD-10-CM | POA: Diagnosis not present

## 2023-06-09 DIAGNOSIS — H40013 Open angle with borderline findings, low risk, bilateral: Secondary | ICD-10-CM | POA: Diagnosis not present

## 2023-06-09 DIAGNOSIS — H2513 Age-related nuclear cataract, bilateral: Secondary | ICD-10-CM | POA: Diagnosis not present

## 2023-06-09 DIAGNOSIS — H524 Presbyopia: Secondary | ICD-10-CM | POA: Diagnosis not present

## 2023-06-09 DIAGNOSIS — E119 Type 2 diabetes mellitus without complications: Secondary | ICD-10-CM | POA: Diagnosis not present

## 2023-06-09 DIAGNOSIS — H52223 Regular astigmatism, bilateral: Secondary | ICD-10-CM | POA: Diagnosis not present

## 2023-06-09 DIAGNOSIS — H5203 Hypermetropia, bilateral: Secondary | ICD-10-CM | POA: Diagnosis not present

## 2023-08-06 IMAGING — DX DG CHEST 1V PORT
2 series · 2 of 2 positions shown · non-contrast
Comparison: 12/27/2019

CLINICAL DATA: Chest pain

EXAM:
PORTABLE CHEST 1 VIEW

[chest ap (1 of 2)]
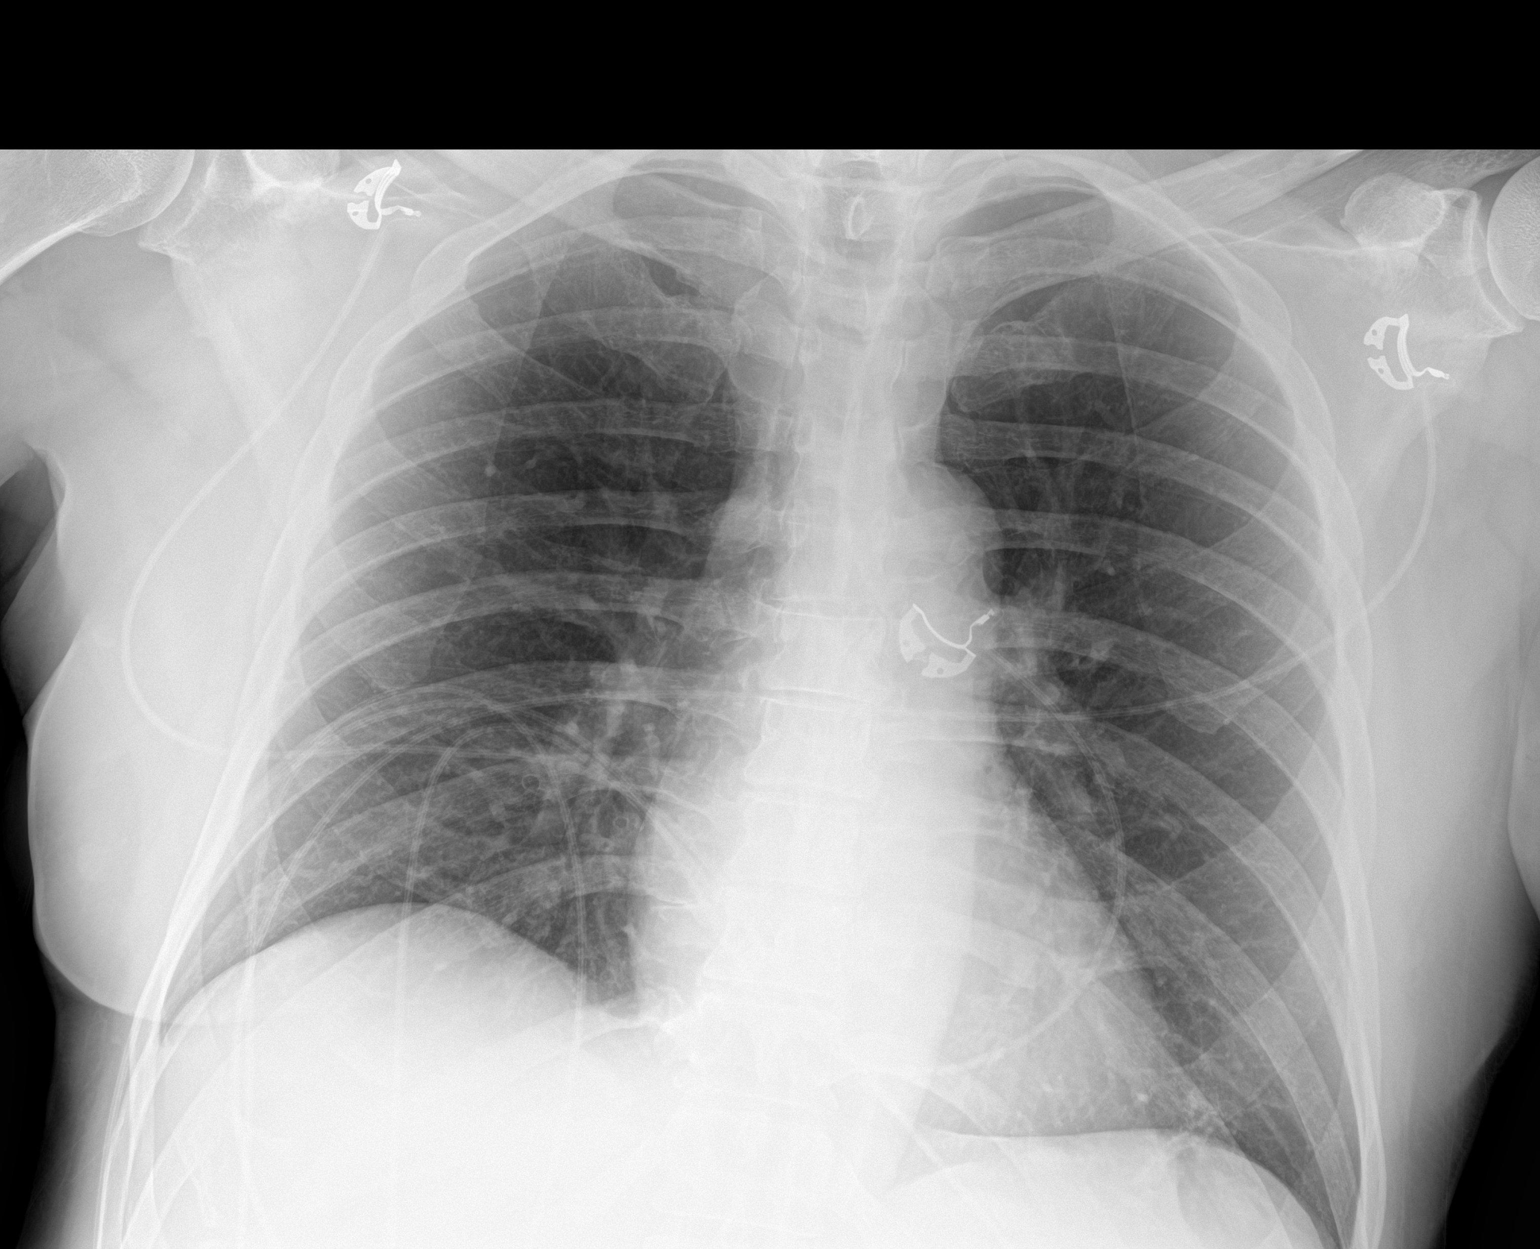

[chest ap (2 of 2)]
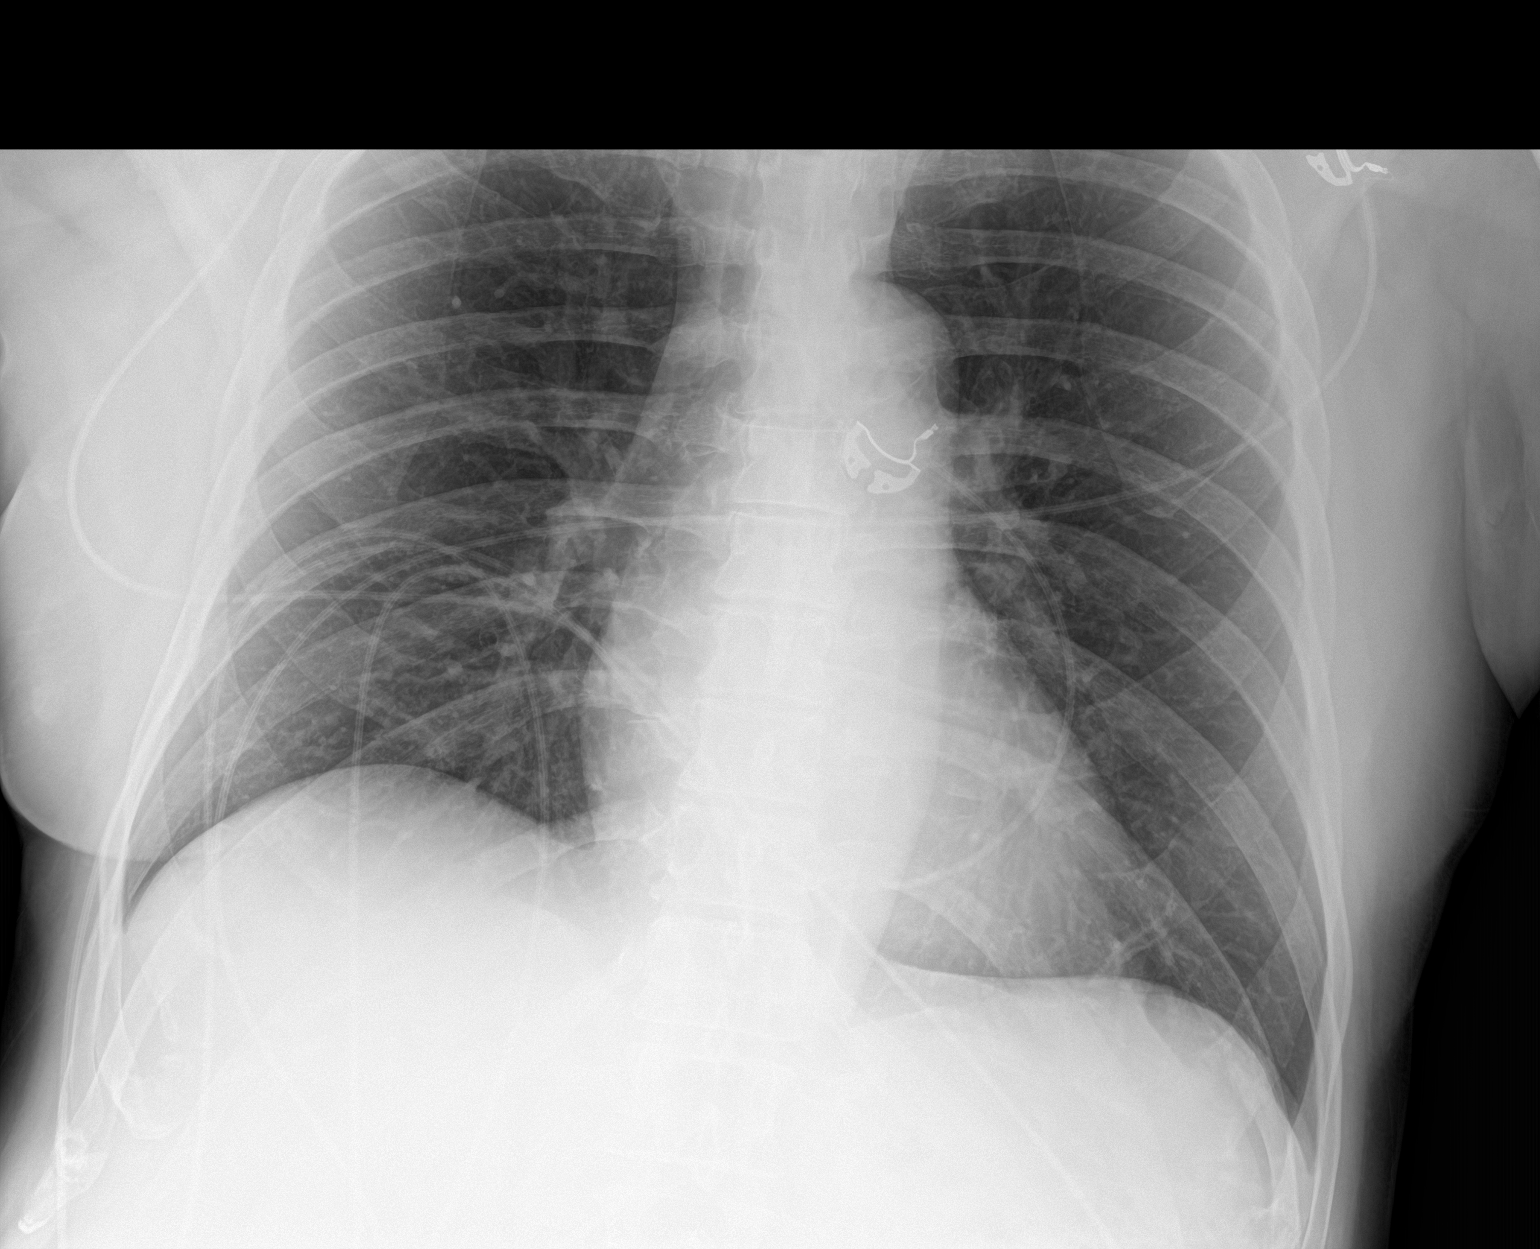

[2 of 2 positions shown; findings below may reference images not displayed]

FINDINGS: The heart size and mediastinal contours are within normal limits.
Both lungs are clear. Hardware in the cervical spine.
IMPRESSION: No active disease.

## 2023-11-22 ENCOUNTER — Other Ambulatory Visit: Payer: Self-pay | Admitting: Physician Assistant

## 2023-11-22 DIAGNOSIS — E1159 Type 2 diabetes mellitus with other circulatory complications: Secondary | ICD-10-CM

## 2023-11-22 DIAGNOSIS — E119 Type 2 diabetes mellitus without complications: Secondary | ICD-10-CM

## 2023-11-22 DIAGNOSIS — I2510 Atherosclerotic heart disease of native coronary artery without angina pectoris: Secondary | ICD-10-CM

## 2023-11-22 DIAGNOSIS — E1169 Type 2 diabetes mellitus with other specified complication: Secondary | ICD-10-CM

## 2023-11-22 DIAGNOSIS — R072 Precordial pain: Secondary | ICD-10-CM

## 2023-11-24 ENCOUNTER — Other Ambulatory Visit: Payer: Self-pay

## 2023-11-24 MED ORDER — METOPROLOL SUCCINATE ER 25 MG PO TB24: 25.0000 mg | ORAL_TABLET | Freq: Every day | ORAL | 1 refills | Status: DC

## 2023-11-24 MED ORDER — ATORVASTATIN CALCIUM 40 MG PO TABS: 40.0000 mg | ORAL_TABLET | Freq: Every day | ORAL | 1 refills | Status: DC

## 2023-12-18 ENCOUNTER — Telehealth: Payer: Self-pay | Admitting: Cardiology

## 2023-12-18 NOTE — Telephone Encounter (Signed)
 Pt c/o Shortness Of Breath: STAT if SOB developed within the last 24 hours or pt is noticeably SOB on the phone  1. Are you currently SOB (can you hear that pt is SOB on the phone)?   No  2. How long have you been experiencing SOB?   3. Are you SOB when sitting or when up moving around?   4. Are you currently experiencing any other symptoms?   Patient has appointment scheduled on 7/2 with E. Daneen, NP.

## 2023-12-18 NOTE — Telephone Encounter (Signed)
 Call to patient to assess symptoms, patient has already been scheduled for appt w/ APP 12/20/23.   Patient reports he has been getting SOB in the heat. 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 He reports that he has also had a little bit of chest pain which resolves quickly when he rests and gets back inside. He states the shortness of breath improves with rest and getting out of the heat as well but it seems to take longer the last few days. He states he has not used any nitroglycerin  because I didn't know how bad it had to be for me to take it.  Patient denies leg swelling and does not track his weight, BP, HR at home. He is a diabetic and reports his blood sugar as been good, 118 and 120 recently. He also has a cough that he has been managing with over the counter cold medication.  Advised patient to take nitroglycerin  the next time he has chest pain, advised he can take up to 3 doses and then needs to call 911 if chest pain is not improved. Patient verbalizes understanding and agrees to plan, agrees to keep appt 12/20/23.

## 2023-12-20 ENCOUNTER — Ambulatory Visit: Admitting: Nurse Practitioner

## 2023-12-20 NOTE — Progress Notes (Deleted)
 Office Visit    Patient Name: Roger Fernandez Date of Encounter: 12/20/2023  Primary Care Provider:  Loreli Elsie JONETTA Mickey., MD Primary Cardiologist:  Wilbert Bihari, MD  Chief Complaint   68 year old male with a history of CAD managed medically, hypertension, hyperlipidemia, and type 2 diabetes who presents for follow-up related to CAD.  Past Medical History    Past Medical History:  Diagnosis Date   CAD (coronary artery disease), native coronary artery    cath 02/24/2021 which showed patent LM, moderately calcified LAD proximally up to 50% and 75-80% D2, patent LCx and RCA and LVEDP and medical therapy was recommended.  There was some thought that he could have microvascular angina.   Hyperlipidemia associated with type 2 diabetes mellitus (HCC)    Hypertension    DR ELSIE LORELI PCP  DR [facility] CARDIAC   Type 2 diabetes mellitus [facility])    Past Surgical History:  Procedure Laterality Date   ANTERIOR CERVICAL DECOMP/DISCECTOMY FUSION  06/06/2011   Procedure: ANTERIOR CERVICAL DECOMPRESSION/DISCECTOMY FUSION 3 LEVELS;  Surgeon: Lamar LELON Peaches;  Location: [facility] NEURO ORS;  Service: Neurosurgery;  Laterality: N/A;  Cervical Three-four,Cervical Four-Five,Cervical Five-Six anterior cervical decompression with fusion plating and bonegraft   APPENDECTOMY     LEFT HEART CATH AND CORONARY ANGIOGRAPHY N/A 02/24/2021   Procedure: LEFT HEART CATH AND CORONARY ANGIOGRAPHY;  Surgeon: Claudene Victory LELON, MD;  Location: [facility] INVASIVE CV LAB;  Service: Cardiovascular;  Laterality: N/A;   TONSILLECTOMY      Allergies  No Known Allergies   Labs/Other Studies Reviewed    The following studies were reviewed today:  Cardiac Studies & Procedures   ______________________________________________________________________________________________ CARDIAC CATHETERIZATION  CARDIAC CATHETERIZATION 02/24/2021  Conclusion   Widely patent left main   Moderate LAD calcification with proximal to mid  irregularities up to 50%.  Second diagonal 75 to 80% ostial.  Vessel is relatively small.   Widely patent circumflex   Widely patent dominant RCA with luminal irregularities noted   Normal LV function with LVEDP 18 mmHg.  Recommendation: Risk factor modification, medical therapy of hypertension, and consideration of alternative explanation for prolonged chest pain.  Microvascular dysfunction cannot be totally excluded as etiology.  Findings Coronary Findings Diagnostic  Dominance: Right  Left Anterior Descending The vessel exhibits minimal luminal irregularities. Prox LAD to Mid LAD lesion is 35% stenosed. Mid LAD lesion is 50% stenosed.  First Diagonal Branch 1st Diag lesion is 25% stenosed.  Second Diagonal Branch 2nd Diag lesion is 80% stenosed.  Right Coronary Artery The vessel exhibits minimal luminal irregularities.  Right Posterior Atrioventricular Artery RPAV lesion is 40% stenosed.  Intervention  No interventions have been documented.   STRESS TESTS  MYOCARDIAL PERFUSION IMAGING 11/16/2022  Narrative   The study is normal. Findings are consistent with no ischemia and no infarction. The study is low risk.   No ST deviation was noted.   LV perfusion is normal. There is no evidence of ischemia. There is no evidence of infarction.   Left ventricular function is normal. End diastolic cavity size is normal. End systolic cavity size is normal.   ECHOCARDIOGRAM  ECHOCARDIOGRAM COMPLETE 12/28/2022  Narrative ECHOCARDIOGRAM REPORT    Patient Name:   Roger Fernandez Date of Exam: 12/28/2022 Medical Rec #:  [id number]      Height:       73.0 in Accession #:    [id number]     Weight:       202.6 lb Date of  Birth:  November 23, 1955      BSA:          2.163 m Patient Age:    66 years       BP:           138/62 mmHg Patient Gender: M              HR:           68 bpm. Exam Location:  [address]  Procedure: 2D Echo, Cardiac Doppler and Color Doppler  Indications:     G45.9 TIA  History:        Patient has prior history of Echocardiogram examinations, most recent 02/24/2021. CAD, TIA; Risk Factors:Hypertension, Dyslipidemia and Diabetes.  Sonographer:    Elsie Bohr RDCS Referring Phys: [phone] TRACI R TURNER  IMPRESSIONS   1. Left ventricular ejection fraction, by estimation, is 60 to 65%. The left ventricle has normal function. The left ventricle has no regional wall motion abnormalities. There is moderate left ventricular hypertrophy. Left ventricular diastolic parameters are consistent with Grade I diastolic dysfunction (impaired relaxation). 2. Right ventricular systolic function is normal. The right ventricular size is normal. Tricuspid regurgitation signal is inadequate for assessing PA pressure. 3. The mitral valve is grossly normal. No evidence of mitral valve regurgitation. 4. The aortic valve is tricuspid. Aortic valve regurgitation is not visualized. 5. The inferior vena cava is normal in size with greater than 50% respiratory variability, suggesting right atrial pressure of 3 mmHg.  FINDINGS Left Ventricle: Left ventricular ejection fraction, by estimation, is 60 to 65%. The left ventricle has normal function. The left ventricle has no regional wall motion abnormalities. The left ventricular internal cavity size was normal in size. There is moderate left ventricular hypertrophy. Left ventricular diastolic parameters are consistent with Grade I diastolic dysfunction (impaired relaxation).  Right Ventricle: The right ventricular size is normal. Right ventricular systolic function is normal. Tricuspid regurgitation signal is inadequate for assessing PA pressure.  Left Atrium: Left atrial size was normal in size.  Right Atrium: Right atrial size was normal in size.  Pericardium: There is no evidence of pericardial effusion.  Mitral Valve: The mitral valve is grossly normal. No evidence of mitral valve regurgitation.  Tricuspid Valve: The  tricuspid valve is normal in structure. Tricuspid valve regurgitation is not demonstrated.  Aortic Valve: The aortic valve is tricuspid. Aortic valve regurgitation is not visualized.  Pulmonic Valve: Pulmonic valve regurgitation is not visualized.  Aorta: The aortic root and ascending aorta are structurally normal, with no evidence of dilitation.  Venous: The inferior vena cava is normal in size with greater than 50% respiratory variability, suggesting right atrial pressure of 3 mmHg.  IAS/Shunts: No atrial level shunt detected by color flow Doppler.   LEFT VENTRICLE PLAX 2D LVIDd:         4.30 cm   Diastology LVIDs:         2.30 cm   LV e' medial:    7.62 cm/s LV PW:         1.30 cm   LV E/e' medial:  8.4 LV IVS:        1.40 cm   LV e' lateral:   9.14 cm/s LVOT diam:     2.20 cm   LV E/e' lateral: 7.0 LV SV:         79 LV SV Index:   37 LVOT Area:     3.80 cm   RIGHT VENTRICLE  IVC RV S prime:     14.40 cm/s  IVC diam: 1.30 cm TAPSE (M-mode): 2.4 cm  LEFT ATRIUM             Index        RIGHT ATRIUM           Index LA diam:        3.30 cm 1.53 cm/m   RA Pressure: 3.00 mmHg LA Vol (A2C):   45.1 ml 20.85 ml/m  RA Area:     9.83 cm LA Vol (A4C):   63.5 ml 29.35 ml/m  RA Volume:   21.20 ml  9.80 ml/m LA Biplane Vol: 58.5 ml 27.04 ml/m AORTIC VALVE LVOT Vmax:   113.00 cm/s LVOT Vmean:  67.700 cm/s LVOT VTI:    0.209 m  AORTA Ao Root diam: 3.60 cm Ao Asc diam:  3.10 cm  MITRAL VALVE               TRICUSPID VALVE MV Area (PHT): 2.63 cm    Estimated RAP:  3.00 mmHg MV Decel Time: 288 msec MV E velocity: 63.90 cm/s  SHUNTS MV A velocity: 81.20 cm/s  Systemic VTI:  0.21 m MV E/A ratio:  0.79        Systemic Diam: 2.20 cm  Ronal Ross Electronically signed by Ronal Ross Signature Date/Time: 12/28/2022/11:10:33 AM    Final          ______________________________________________________________________________________________     Recent  Labs: No results found for requested labs within last 365 days.  Recent Lipid Panel    Component Value Date/Time   CHOL 92 (L) 11/16/2022 0756   TRIG 96 11/16/2022 0756   HDL 38 (L) 11/16/2022 0756   CHOLHDL 2.4 11/16/2022 0756   CHOLHDL 3.0 02/24/2021 0259   VLDL 32 02/24/2021 0259   LDLCALC 35 11/16/2022 0756    History of Present Illness    68 year old male with the above past medical history including CAD managed medically, hypertension, hyperlipidemia, and type 2 diabetes.  He was referred to cardiology in the setting of chest pain, shortness of breath.  Cardiac catheterization in 02/2021 revealed 35% proximal and mid LAD stenosis, 50% mid LAD stenosis, 25% D1 stenosis, 80% D2 stenosis, 40% RPA V stenosis, normal LV function, EF 55 to 65%.  Medical therapy was advised.  Myoview  in 10/2022 in the setting of recurrent chest pain shortness of breath showed no evidence of ischemia or infarction, EF 61%.  Most recent echocardiogram in 12/2022 showed EF 60 to 65%, normal LV function, no RWMA, G1 DD, normal RV systolic function, no significant valvular abnormalities.  He was last seen in the office on 05/08/2023 and was stable from a cardiac standpoint.  He denied symptoms concerning for angina.  He contacted our office on 12/18/2023 with concerns for increased shortness of breath with activity, intermittent chest discomfort.  He presents today for follow-up.  Since his last visit  CAD: Hypertension: Hyperlipidemia: Type 2 diabetes: Disposition:  Home Medications    Current Outpatient Medications  Medication Sig Dispense Refill   Apoaequorin (PREVAGEN PO) Take 1 capsule by mouth daily.     aspirin  EC 81 MG tablet Take 81 mg by mouth daily. Swallow whole.     atorvastatin  (LIPITOR) 40 MG tablet Take 1 tablet (40 mg total) by mouth daily. 90 tablet 1   Casc[place] 450 MG CAPS Take 450 mg by mouth daily.     lisinopril -hydrochlorothiazide  (PRINZIDE ,ZESTORETIC ) 20-12.5 MG per tablet  Take 1  tablet by mouth daily.      metFORMIN  (GLUCOPHAGE ) 1000 MG tablet Take 1,000 mg by mouth 2 (two) times daily.     metoprolol  succinate (TOPROL -XL) 25 MG 24 hr tablet Take 1 tablet (25 mg total) by mouth daily. 90 tablet 1   nitroGLYCERIN  (NITROSTAT ) 0.4 MG SL tablet Place 1 tablet (0.4 mg total) under the tongue every 5 (five) minutes x 3 doses as needed for chest pain. 25 tablet 2   PRESCRIPTION MEDICATION Apply 1 application topically daily. Testosterone  compounded cream.     ranolazine  (RANEXA ) 500 MG 12 hr tablet TAKE 1 TABLET BY MOUTH TWICE A DAY 180 tablet 1   Turmeric (QC TUMERIC COMPLEX) 500 MG CAPS Take 1 capsule by mouth in the morning.     Valerian 500 MG CAPS Take 500 mg by mouth daily as needed (calming.).     No current facility-administered medications for this visit.     Review of Systems    ***.  All other systems reviewed and are otherwise negative except as noted above.    Physical Exam    VS:  There were no vitals taken for this visit. , BMI There is no height or weight on file to calculate BMI.     GEN: Well nourished, well developed, in no acute distress. HEENT: normal. Neck: Supple, no JVD, carotid bruits, or masses. Cardiac: RRR, no murmurs, rubs, or gallops. No clubbing, cyanosis, edema.  Radials/DP/PT 2+ and equal bilaterally.  Respiratory:  Respirations regular and unlabored, clear to auscultation bilaterally. GI: Soft, nontender, nondistended, BS + x 4. MS: no deformity or atrophy. Skin: warm and dry, no rash. Neuro:  Strength and sensation are intact. Psych: Normal affect.  Accessory Clinical Findings    ECG personally reviewed by me today -    - no acute changes.   Lab Results  Component Value Date   WBC 11.2 (H) 10/24/2022   HGB 14.6 10/24/2022   HCT 42.5 10/24/2022   MCV 87.1 10/24/2022   PLT 292 10/24/2022   Lab Results  Component Value Date   CREATININE 1.08 10/24/2022   BUN 9 10/24/2022   NA 136 10/24/2022   K 4.3 10/24/2022    CL 98 10/24/2022   CO2 25 10/24/2022   Lab Results  Component Value Date   ALT 26 10/24/2022   AST 21 10/24/2022   ALKPHOS 47 10/24/2022   BILITOT 0.6 10/24/2022   Lab Results  Component Value Date   CHOL 92 (L) 11/16/2022   HDL 38 (L) 11/16/2022   LDLCALC 35 11/16/2022   TRIG 96 11/16/2022   CHOLHDL 2.4 11/16/2022    Lab Results  Component Value Date   HGBA1C 6.8 (H) 02/23/2021    Assessment & Plan    1.  ***  No BP recorded.  {Refresh Note OR Click here to enter BP  :1}***   Damien JAYSON Braver, NP 12/20/2023, 6:54 AM

## 2024-01-02 NOTE — Progress Notes (Deleted)
 Cardiology Office Note    Patient Name: Roger Fernandez Date of Encounter: 01/02/2024  Primary Care Provider:  Loreli Roger JONETTA Fernandez., MD Primary Cardiologist:  Roger Bihari, MD Primary Electrophysiologist: None   Past Medical History    Past Medical History:  Diagnosis Date   CAD (coronary artery disease), native coronary artery    cath 02/24/2021 which showed patent LM, moderately calcified LAD proximally up to 50% and 75-80% D2, patent LCx and RCA and LVEDP and medical therapy was recommended.  There was some thought that he could have microvascular angina.   Hyperlipidemia associated with type 2 diabetes mellitus (HCC)    Hypertension    DR Roger Fernandez PCP  DR Roger Fernandez CARDIAC   Type 2 diabetes mellitus [facility])     History of Present Illness   Roger Fernandez is a 68 year old male with t a past medical history of CAD managed medically, hypertension, hyperlipidemia, and type 2 diabetes.  He was referred to cardiology in the setting of chest pain, shortness of breath.  Cardiac catheterization in 02/2021 revealed 35% proximal and mid LAD stenosis, 50% mid LAD stenosis, 25% D1 stenosis, 80% D2 stenosis, 40% RPA V stenosis, normal LV function, EF 55 to 65%.  Medical therapy was advised with Ranexa  due to possible microvascular angina.  Myoview  in 10/2022 in the setting of recurrent chest pain shortness of breath showed no evidence of ischemia or infarction, EF 61%.  Most recent echocardiogram in 12/2022 showed EF 60 to 65%, normal LV function, no RWMA, G1 DD, normal RV systolic function, no significant valvular abnormalities.  He was last seen in the office on 05/08/2023 and was stable from a cardiac standpoint.  He denied symptoms concerning for angina.  He contacted our office on 12/18/2023 with concerns for increased shortness of breath with activity, intermittent chest discomfort.   Patient denies chest pain, palpitations, dyspnea, PND, orthopnea, nausea, vomiting, dizziness, syncope,  edema, weight gain, or early satiety.   Discussed the use of AI scribe software for clinical note transcription with the patient, who gave verbal consent to proceed.  History of Present Illness    ***Notes:   Review of Systems  Please see the history of present illness.    All other systems reviewed and are otherwise negative except as noted above.  Physical Exam    Wt Readings from Last 3 Encounters:  05/08/23 205 lb 12.8 oz (93.4 kg)  11/25/22 202 lb 9.6 oz (91.9 kg)  11/16/22 203 lb (92.1 kg)   CD:Uyzmz were no vitals filed for this visit.,There is no height or weight on file to calculate BMI. GEN: Well nourished, well developed in no acute distress Neck: No JVD; No carotid bruits Pulmonary: Clear to auscultation without rales, wheezing or rhonchi  Cardiovascular: Normal rate. Regular rhythm. Normal S1. Normal S2.   Murmurs: There is no murmur.  ABDOMEN: Soft, non-tender, non-distended EXTREMITIES:  No edema; No deformity   EKG/LABS/ Recent Cardiac Studies   ECG personally reviewed by me today - ***  Risk Assessment/Calculations:   {Does this patient have ATRIAL FIBRILLATION?:[phone]}      Lab Results  Component Value Date   WBC 11.2 (H) 10/24/2022   HGB 14.6 10/24/2022   HCT 42.5 10/24/2022   MCV 87.1 10/24/2022   PLT 292 10/24/2022   Lab Results  Component Value Date   CREATININE 1.08 10/24/2022   BUN 9 10/24/2022   NA 136 10/24/2022   K 4.3 10/24/2022   CL 98 10/24/2022  CO2 25 10/24/2022   Lab Results  Component Value Date   CHOL 92 (L) 11/16/2022   HDL 38 (L) 11/16/2022   LDLCALC 35 11/16/2022   TRIG 96 11/16/2022   CHOLHDL 2.4 11/16/2022    Lab Results  Component Value Date   HGBA1C 6.8 (H) 02/23/2021   Assessment & Plan    Assessment and Plan Assessment & Plan     1.  CAD: -s/p LHC 02/2021 with patent left main and moderate calcified LAD of 50% and 75 as 80% D2 with patent left circumflex and RCA   2.  Chest pain  3.   Essential HTN  4.  HLD  5.  DM type II      Disposition: Follow-up with Roger Bihari, MD or APP in *** months {Are you ordering a CV Procedure (e.g. stress test, cath, DCCV, TEE, etc)?   Press F2        :[id number]}   Signed, Roger Fernandez, Roger Shove, NP 01/02/2024, 11:19 AM [facility]

## 2024-01-03 ENCOUNTER — Ambulatory Visit: Admitting: Nurse Practitioner

## 2024-01-03 DIAGNOSIS — I2510 Atherosclerotic heart disease of native coronary artery without angina pectoris: Secondary | ICD-10-CM

## 2024-01-03 DIAGNOSIS — E1159 Type 2 diabetes mellitus with other circulatory complications: Secondary | ICD-10-CM

## 2024-01-03 DIAGNOSIS — E1169 Type 2 diabetes mellitus with other specified complication: Secondary | ICD-10-CM

## 2024-01-03 DIAGNOSIS — E119 Type 2 diabetes mellitus without complications: Secondary | ICD-10-CM

## 2024-01-03 DIAGNOSIS — I259 Chronic ischemic heart disease, unspecified: Secondary | ICD-10-CM

## 2024-01-15 NOTE — Progress Notes (Unsigned)
 Cardiology Office Note:   Date:  01/16/2024  ID:  THEO KRUMHOLZ, DOB March 17, 1956, MRN [id number] PCP: Loreli Elsie JONETTA Mickey., MD  [facility] Providers Cardiologist:  Wilbert Bihari, MD    History of Present Illness:   Discussed the use of AI scribe software for clinical note transcription with the patient, who gave verbal consent to proceed.  History of Present Illness Roger Fernandez is a 68 year old male with coronary artery disease, hypertension, hyperlipidemia, and type 2 diabetes who presents with increased shortness of breath and intermittent chest discomfort.  He experiences increased shortness of breath, particularly in hot weather and during physical activity. He feels exhausted more quickly and experiences shortness of breath after leaving air-conditioned environments. He sometimes feels discomfort around the heart, which is hard to describe but occasionally wakes him at night. He manages his activities to avoid overexertion, especially in hot and humid conditions, which can lead to disorientation and lightheadedness.  He describes the chest sensation as a 'little tightness' that can last over five minutes, often occurring with shortness of breath rather than at night. He has not taken nitroglycerin  frequently, only once this summer, which helped alleviate symptoms.  No swelling in the legs. At night, he sometimes wakes up needing to take a deep breath. He experiences occasional nausea but not in conjunction with shortness of breath. He drinks cold water to help relax during these episodes.  He has had to significantly reduce physical activity due to symptoms, avoiding exertion in hot weather. He has not noticed irregular heartbeats but feels his heart is 'weak' at times, even when sitting still.  Studies Reviewed:    EKG:   EKG Interpretation Date/Time:  Tuesday January 16 2024 08:51:58 EDT Ventricular Rate:  75 PR Interval:  176 QRS Duration:  90 QT Interval:  374 QTC  Calculation: 417 R Axis:   65  Text Interpretation: Normal sinus rhythm Nonspecific T wave abnormality Early repolarization When compared with ECG of 24-Oct-2022 11:26, No significant change was found Confirmed by Trudy Birmingham [phone]) on 01/16/2024 8:58:48 AM      Risk Assessment/Calculations:              Physical Exam:   VS:  BP 114/68 (BP Location: Left Arm, Patient Position: Sitting)   Pulse 75   Ht 6' 1 (1.854 m)   Wt 202 lb (91.6 kg)   SpO2 96%   BMI 26.65 kg/m    Wt Readings from Last 3 Encounters:  01/16/24 202 lb (91.6 kg)  05/08/23 205 lb 12.8 oz (93.4 kg)  11/25/22 202 lb 9.6 oz (91.9 kg)     Physical Exam Vitals reviewed.  Constitutional:      Appearance: Normal appearance.  HENT:     Head: Normocephalic.  Eyes:     Pupils: Pupils are equal, round, and reactive to light.  Cardiovascular:     Rate and Rhythm: Normal rate and regular rhythm.     Pulses: Normal pulses.     Heart sounds: Normal heart sounds.  Pulmonary:     Effort: Pulmonary effort is normal.     Breath sounds: Normal breath sounds.  Abdominal:     General: Abdomen is flat.     Palpations: Abdomen is soft.  Musculoskeletal:     Right lower leg: No edema.     Left lower leg: No edema.  Skin:    General: Skin is warm and dry.     Capillary Refill: Capillary refill takes less  than 2 seconds.  Neurological:     General: No focal deficit present.     Mental Status: He is alert and oriented to person, place, and time.  Psychiatric:        Mood and Affect: Mood normal.        Behavior: Behavior normal.        Thought Content: Thought content normal.        Judgment: Judgment normal.     ASSESSMENT AND PLAN:     Assessment and Plan Assessment & Plan Coronary artery disease with recurrent angina symptoms Recurrent angina with increased dyspnea and intermittent chest discomfort, exacerbated by heat and exertion. Previous cardiac catheterization in 2022 indicated moderate  non-obstructive disease. 2024 Myoview  stress test without ischemia/infarction. Current symptoms suggest possible progression of coronary artery disease or microvascular disease. EKG shows no acute ischemia. - Increase Ranexa  to 1000 mg twice daily. Hx intolerance to Imdur  with headache. - Discussed with Dr. Shlomo, will order PET CT stress test - Refill nitroglycerin  prescription. - Continue ASA 81mg , Toprol  XL 25mg   Hypertension BP stable on exam today.  - Continue Lisinopril  hydrochlorothiazide  and Toprol  XL  Hyperlipidemia Goal LDL 70mg /dL. Typically checked by PCP, at goal per 01/13/23 labs. - Continue Atorvastatin  40mg .      Informed Consent   Shared Decision Making/Informed Consent The risks [chest pain, shortness of breath, cardiac arrhythmias, dizziness, blood pressure fluctuations, myocardial infarction, stroke/transient ischemic attack, nausea, vomiting, allergic reaction, radiation exposure, metallic taste sensation and life-threatening complications (estimated to be 1 in 10,000)], benefits (risk stratification, diagnosing coronary artery disease, treatment guidance) and alternatives of a cardiac PET stress test were discussed in detail with Mr. Shapley and he agrees to proceed.      Signed, Artist Pouch, PA-C

## 2024-01-16 ENCOUNTER — Encounter: Payer: Self-pay | Admitting: Cardiology

## 2024-01-16 ENCOUNTER — Ambulatory Visit: Attending: Internal Medicine | Admitting: Cardiology

## 2024-01-16 VITALS — BP 114/68 | HR 75 | Ht 73.0 in | Wt 202.0 lb

## 2024-01-16 DIAGNOSIS — E119 Type 2 diabetes mellitus without complications: Secondary | ICD-10-CM

## 2024-01-16 DIAGNOSIS — I2510 Atherosclerotic heart disease of native coronary artery without angina pectoris: Secondary | ICD-10-CM

## 2024-01-16 DIAGNOSIS — I152 Hypertension secondary to endocrine disorders: Secondary | ICD-10-CM

## 2024-01-16 DIAGNOSIS — R072 Precordial pain: Secondary | ICD-10-CM

## 2024-01-16 DIAGNOSIS — R0602 Shortness of breath: Secondary | ICD-10-CM

## 2024-01-16 DIAGNOSIS — E1159 Type 2 diabetes mellitus with other circulatory complications: Secondary | ICD-10-CM

## 2024-01-16 DIAGNOSIS — E785 Hyperlipidemia, unspecified: Secondary | ICD-10-CM

## 2024-01-16 DIAGNOSIS — E1169 Type 2 diabetes mellitus with other specified complication: Secondary | ICD-10-CM

## 2024-01-16 MED ORDER — NITROGLYCERIN 0.4 MG SL SUBL
0.4000 mg | SUBLINGUAL_TABLET | SUBLINGUAL | 2 refills | Status: AC | PRN
Start: 2024-01-16 — End: ?

## 2024-01-16 MED ORDER — RANOLAZINE ER 1000 MG PO TB12
1000.0000 mg | ORAL_TABLET | Freq: Two times a day (BID) | ORAL | 2 refills | Status: AC
Start: 2024-01-16 — End: ?

## 2024-01-16 NOTE — Patient Instructions (Signed)
 Medication Instructions:  INCREASE RANEXA  TO 1000 MG TWICE A DAY *If you need a refill on your cardiac medications before your next appointment, please call your pharmacy*  Lab Work: NO LABS If you have labs (blood work) drawn today and your tests are completely normal, you will receive your results only by: MyChart Message (if you have MyChart) OR A paper copy in the mail If you have any lab test that is abnormal or we need to change your treatment, we will call you to review the results.  Testing/Procedures: NO TESTING  Follow-Up: At [facility], you and your health needs are our priority.  As part of our continuing mission to provide you with exceptional heart care, our providers are all part of one team.  This team includes your primary Cardiologist (physician) and Advanced Practice Providers or APPs (Physician Assistants and Nurse Practitioners) who all work together to provide you with the care you need, when you need it.  Your next appointment:   1-2 month(s)  Provider:   Wilbert Bihari, MD

## 2024-02-01 ENCOUNTER — Ambulatory Visit
Admission: RE | Admit: 2024-02-01 | Discharge: 2024-02-01 | Disposition: A | Discharge status: Home / Self Care | Source: Ambulatory Visit | Attending: Cardiology | Admitting: Cardiology

## 2024-02-01 ENCOUNTER — Ambulatory Visit: Payer: Self-pay | Admitting: Cardiology

## 2024-02-01 DIAGNOSIS — R072 Precordial pain: Secondary | ICD-10-CM | POA: Diagnosis present

## 2024-02-01 DIAGNOSIS — I700 Atherosclerosis of aorta: Secondary | ICD-10-CM | POA: Diagnosis not present

## 2024-02-01 DIAGNOSIS — I2510 Atherosclerotic heart disease of native coronary artery without angina pectoris: Secondary | ICD-10-CM | POA: Insufficient documentation

## 2024-02-01 LAB — NM PET CT CARDIAC PERFUSION MULTI W/ABSOLUTE BLOODFLOW
MBFR: 2.54
Nuc Rest EF: 51 %
Nuc Stress EF: 62 %
Peak HR: 85 {beats}/min
Rest HR: 75 {beats}/min
Rest MBF: 0.61 ml/g/min
Rest Nuclear Isotope Dose: 21.5 mCi
SRS: 0
SSS: 0
ST Depression (mm): 0 mm
Stress MBF: 1.55 ml/g/min
Stress Nuclear Isotope Dose: 21.6 mCi
TID: 1.11

## 2024-02-01 MED ORDER — REGADENOSON 0.4 MG/5ML IV SOLN
INTRAVENOUS | Status: AC
  Filled 2024-02-01: qty 5

## 2024-02-01 MED ORDER — RUBIDIUM RB82 GENERATOR (RUBYFILL)
25.0000 | PACK | Freq: Once | INTRAVENOUS | Status: AC
  Administered 2024-02-01: 21.63 via INTRAVENOUS

## 2024-02-01 MED ORDER — REGADENOSON 0.4 MG/5ML IV SOLN
0.4000 mg | Freq: Once | INTRAVENOUS | Status: AC
  Administered 2024-02-01: 0.4 mg via INTRAVENOUS
  Filled 2024-02-01: qty 5

## 2024-02-01 MED ORDER — RUBIDIUM RB82 GENERATOR (RUBYFILL)
25.0000 | PACK | Freq: Once | INTRAVENOUS | Status: AC
  Administered 2024-02-01: 21.54 via INTRAVENOUS

## 2024-02-01 NOTE — Progress Notes (Signed)
 Patient presents for a cardiac PET stress test and tolerated procedure without incident. Patient maintained acceptable vital signs throughout the test and was offered caffeine  after test.  Patient ambulated out of department with a steady gait.

## 2024-03-19 ENCOUNTER — Other Ambulatory Visit: Payer: Self-pay | Admitting: Cardiology

## 2024-04-21 NOTE — Progress Notes (Unsigned)
 Cardiology office note  Date:  04/22/2024   ID:  Roger, Fernandez 1955/10/20, MRN [id number]  PCP:  Roger Elsie JONETTA Mickey., MD  Cardiologist:  Roger Bihari, MD   Chief Complaint  Patient presents with   Coronary Artery Disease   Hypertension   Hyperlipidemia     History of Present Illness:  Roger Fernandez is a 68 y.o. male  with a hx of HLD, DM2 and HTN who was initially referred for SOB and chest pain. He ultimately underwent cath 02/24/2021 which showed patent LM, moderately calcified LAD proximally up to 50% and 75-80% D2, patent LCx and RCA and LVEDP and medical therapy was recommended.  There was some thought that he could have microvascular angina.    He was seen by Roger Pavy, PA 11/08/2022 for chest pain and SOB and underwent stress testing and showed no ischemia and normal LVF.  Chest pain subsequently resolved.  He is here today for 1 year follow-up and is doing well.  He denies any chest pain or pressure, SOB, DOE (except when the humidity is high), PND, orthopnea, lower extremity edema, palpitations, dizziness or syncope.  Past Medical History:  Diagnosis Date   CAD (coronary artery disease), native coronary artery    cath 02/24/2021 which showed patent LM, moderately calcified LAD proximally up to 50% and 75-80% D2, patent LCx and RCA and LVEDP and medical therapy was recommended.  There was some thought that he could have microvascular angina.   Hyperlipidemia associated with type 2 diabetes mellitus (HCC)    Hypertension    DR ELSIE Roger PCP  DR [facility] CARDIAC   Type 2 diabetes mellitus [facility])     Past Surgical History:  Procedure Laterality Date   ANTERIOR CERVICAL DECOMP/DISCECTOMY FUSION  06/06/2011   Procedure: ANTERIOR CERVICAL DECOMPRESSION/DISCECTOMY FUSION 3 LEVELS;  Surgeon: Roger Fernandez;  Location: [facility] NEURO ORS;  Service: Neurosurgery;  Laterality: N/A;  Cervical Three-four,Cervical Four-Five,Cervical Five-Six anterior cervical  decompression with fusion plating and bonegraft   APPENDECTOMY     LEFT HEART CATH AND CORONARY ANGIOGRAPHY N/A 02/24/2021   Procedure: LEFT HEART CATH AND CORONARY ANGIOGRAPHY;  Surgeon: Roger Victory LELON, MD;  Location: [facility] INVASIVE CV LAB;  Service: Cardiovascular;  Laterality: N/A;   TONSILLECTOMY      Current Medications: Current Meds  Medication Sig   Apoaequorin (PREVAGEN PO) Take 1 capsule by mouth daily.   aspirin  EC 81 MG tablet Take 81 mg by mouth daily. Swallow whole.   atorvastatin  (LIPITOR) 40 MG tablet TAKE 1 TABLET BY MOUTH EVERY DAY   Cascara [place] 450 MG CAPS Take 450 mg by mouth daily.   lisinopril -hydrochlorothiazide  (PRINZIDE ,ZESTORETIC ) 20-12.5 MG per tablet Take 1 tablet by mouth daily.    metFORMIN  (GLUCOPHAGE ) 1000 MG tablet Take 1,000 mg by mouth 2 (two) times daily.   metoprolol  succinate (TOPROL -XL) 25 MG 24 hr tablet TAKE 1 TABLET (25 MG TOTAL) BY MOUTH DAILY.   nitroGLYCERIN  (NITROSTAT ) 0.4 MG SL tablet Place 1 tablet (0.4 mg total) under the tongue every 5 (five) minutes x 3 doses as needed for chest pain.   PRESCRIPTION MEDICATION Apply 1 application topically daily. Testosterone  compounded cream.   ranolazine  (RANEXA ) 1000 MG SR tablet Take 1 tablet (1,000 mg total) by mouth 2 (two) times daily.   Turmeric (QC TUMERIC COMPLEX) 500 MG CAPS Take 1 capsule by mouth in the morning.   Valerian 500 MG CAPS Take 500 mg by mouth  daily as needed (calming.).    Allergies:   Patient has no known allergies.   Social History   Socioeconomic History   Marital status: Married    Spouse name: Not on file   Number of children: Not on file   Years of education: Not on file   Highest education level: Not on file  Occupational History   Not on file  Tobacco Use   Smoking status: Some Days    Types: Cigars   Smokeless tobacco: Never  Substance and Sexual Activity   Alcohol  use: Yes    Comment: OCC.   Drug use: No   Sexual activity: Not on file  Other Topics  Concern   Not on file  Social History Narrative   Right handed   Lives at home with wife   Caffeine 8oz coffee daily   Social Drivers of Health   Financial Resource Strain: Not on file  Food Insecurity: Not on file  Transportation Needs: Not on file  Physical Activity: Not on file  Stress: Not on file  Social Connections: Not on file     Family History:  The patient's family history includes Alcoholism in his brother; Bladder Cancer in his father; Coronary artery disease (age of onset: 5) in his mother; Kidney failure in his mother.   ROS:   Please see the history of present illness.    ROS All other systems reviewed and are negative.      No data to display             PHYSICAL EXAM:   VS:  BP (!) 142/74 (BP Location: Left Arm, Patient Position: Sitting)   Pulse 75   Ht 6' 1 (1.854 m)   Wt 207 lb (93.9 kg)   SpO2 98%   BMI 27.31 kg/m     Wt Readings from Last 3 Encounters:  04/22/24 207 lb (93.9 kg)  01/16/24 202 lb (91.6 kg)  05/08/23 205 lb 12.8 oz (93.4 kg)    GEN: Well nourished, well developed in no acute distress HEENT: Normal NECK: No JVD; No carotid bruits LYMPHATICS: No lymphadenopathy CARDIAC:RRR, no murmurs, rubs, gallops RESPIRATORY:  Clear to auscultation without rales, wheezing or rhonchi  ABDOMEN: Soft, non-tender, non-distended MUSCULOSKELETAL:  No edema; No deformity  SKIN: Warm and dry NEUROLOGIC:  Alert and oriented x 3 PSYCHIATRIC:  Normal affect  Studies/Labs Reviewed:    Recent Labs: No results found for requested labs within last 365 days.   Lipid Panel    Component Value Date/Time   CHOL 92 (L) 11/16/2022 0756   TRIG 96 11/16/2022 0756   HDL 38 (L) 11/16/2022 0756   CHOLHDL 2.4 11/16/2022 0756   CHOLHDL 3.0 02/24/2021 0259   VLDL 32 02/24/2021 0259   LDLCALC 35 11/16/2022 0756   Additional studies/ records that were reviewed today include:  OV notes from PCP    ASSESSMENT:    1. Coronary artery disease  involving native coronary artery of native heart without angina pectoris   2. Hypertension associated with diabetes (HCC)   3. Hyperlipidemia associated with type 2 diabetes mellitus (HCC)       PLAN:  In order of problems listed above:  ASCAD -cath 02/24/2021 showed patent LM, moderately calcified LAD proximally up to 50% and 75-80% D2, patent LCx and RCA and LVEDP and medical therapy was recommended.  There was some thought that he could have microvascular angina. -He was started on Ranexa  for possible microvascalar angina.   -Nuclear  stress test showed no ischemia -He has not had any anginal symptoms in the past year -Continue aspirin  81 mg daily, atorvastatin  40 mg daily, Toprol -XL 25 mg daily and Ranexa  1000 mg twice daily with as needed refills -His PCP just checked all his labs and I will get a copy  2.  HTN -BP controlled on exam today -Continue lisinopril  HCT 20-12.5 mg daily, Toprol  XL 25 mg daily with as needed refills  3.  HLD -LDL goal < 70  -I will get a copy of the FLP and ALT that his PCP just ordered -Continue atorvastatin  40 mg daily with as needed refills   Medication Adjustments/Labs and Tests Ordered: Current medicines are reviewed at length with the patient today.  Concerns regarding medicines are outlined above.  Medication changes, Labs and Tests ordered today are listed in the Patient Instructions below.  Followup with me in 1 year   Signed, Roger Bihari, MD  04/22/2024 9:38 AM    [facility] [address], [place], [place]  [address] Phone: [phone]; Fax: [phone]

## 2024-04-22 ENCOUNTER — Ambulatory Visit: Attending: Cardiology | Admitting: Cardiology

## 2024-04-22 ENCOUNTER — Encounter: Payer: Self-pay | Admitting: Cardiology

## 2024-04-22 VITALS — BP 142/74 | HR 75 | Ht 73.0 in | Wt 207.0 lb

## 2024-04-22 DIAGNOSIS — E1159 Type 2 diabetes mellitus with other circulatory complications: Secondary | ICD-10-CM | POA: Diagnosis not present

## 2024-04-22 DIAGNOSIS — E1169 Type 2 diabetes mellitus with other specified complication: Secondary | ICD-10-CM

## 2024-04-22 DIAGNOSIS — I2510 Atherosclerotic heart disease of native coronary artery without angina pectoris: Secondary | ICD-10-CM

## 2024-04-22 DIAGNOSIS — E785 Hyperlipidemia, unspecified: Secondary | ICD-10-CM

## 2024-04-22 DIAGNOSIS — I152 Hypertension secondary to endocrine disorders: Secondary | ICD-10-CM | POA: Diagnosis not present

## 2024-04-22 NOTE — Patient Instructions (Signed)
 Medication Instructions:  No medication changes were made at this visit. Continue current regimen.   *If you need a refill on your cardiac medications before your next appointment, please call your pharmacy*  Lab Work: None ordered today. If you have labs (blood work) drawn today and your tests are completely normal, you will receive your results only by: MyChart Message (if you have MyChart) OR A paper copy in the mail If you have any lab test that is abnormal or we need to change your treatment, we will call you to review the results.  Testing/Procedures: None ordered today.  Follow-Up: At [facility], you and your health needs are our priority.  As part of our continuing mission to provide you with exceptional heart care, our providers are all part of one team.  This team includes your primary Cardiologist (physician) and Advanced Practice Providers or APPs (Physician Assistants and Nurse Practitioners) who all work together to provide you with the care you need, when you need it.  Your next appointment:   1 year(s)  Provider:   Wilbert Bihari, MD    We recommend signing up for the patient portal called MyChart.  Sign up information is provided on this After Visit Summary.  MyChart is used to connect with patients for Virtual Visits (Telemedicine).  Patients are able to view lab/test results, encounter notes, upcoming appointments, etc.  Non-urgent messages can be sent to your provider as well.   To learn more about what you can do with MyChart, go to [email or web address].   Other Instructions Please have your primary care provider provide our office with a copy of your CMP and lipid panel lab work. They can fax the results to our office at [phone]!
